# Patient Record
Sex: Female | Born: 1995 | Race: White | Hispanic: No | Marital: Married | Smoking: Never smoker
Health system: Southern US, Community
[De-identification: ages and names within clinical notes are randomized; demographics above are authoritative.]

## PROBLEM LIST (undated history)

## (undated) ENCOUNTER — Inpatient Hospital Stay (HOSPITAL_COMMUNITY): Payer: Self-pay

## (undated) DIAGNOSIS — N200 Calculus of kidney: Secondary | ICD-10-CM

## (undated) DIAGNOSIS — R011 Cardiac murmur, unspecified: Secondary | ICD-10-CM

## (undated) DIAGNOSIS — G43909 Migraine, unspecified, not intractable, without status migrainosus: Secondary | ICD-10-CM

## (undated) DIAGNOSIS — J45909 Unspecified asthma, uncomplicated: Secondary | ICD-10-CM

## (undated) HISTORY — PX: APPENDECTOMY: SHX54

## (undated) HISTORY — DX: Migraine, unspecified, not intractable, without status migrainosus: G43.909

## (undated) HISTORY — PX: TONSILLECTOMY: SUR1361

## (undated) HISTORY — PX: FOOT SURGERY: SHX648

---
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
## 2017-12-06 ENCOUNTER — Other Ambulatory Visit (HOSPITAL_COMMUNITY): Payer: Self-pay | Admitting: Obstetrics and Gynecology

## 2017-12-06 DIAGNOSIS — R590 Localized enlarged lymph nodes: Secondary | ICD-10-CM

## 2017-12-16 ENCOUNTER — Ambulatory Visit (HOSPITAL_COMMUNITY)
Admission: RE | Admit: 2017-12-16 | Discharge: 2017-12-16 | Disposition: A | Discharge status: Home / Self Care | Payer: 59 | Source: Ambulatory Visit | Attending: Obstetrics and Gynecology | Admitting: Obstetrics and Gynecology

## 2017-12-16 ENCOUNTER — Other Ambulatory Visit (HOSPITAL_COMMUNITY): Payer: Self-pay | Admitting: Obstetrics and Gynecology

## 2017-12-16 DIAGNOSIS — R590 Localized enlarged lymph nodes: Secondary | ICD-10-CM | POA: Diagnosis not present

## 2017-12-16 DIAGNOSIS — M79605 Pain in left leg: Secondary | ICD-10-CM | POA: Diagnosis present

## 2018-04-02 ENCOUNTER — Emergency Department (HOSPITAL_COMMUNITY)
Admission: EM | Admit: 2018-04-02 | Discharge: 2018-04-02 | Disposition: A | Discharge status: Home / Self Care | Payer: 59 | Attending: Emergency Medicine | Admitting: Emergency Medicine

## 2018-04-02 ENCOUNTER — Emergency Department (HOSPITAL_COMMUNITY): Payer: 59

## 2018-04-02 ENCOUNTER — Encounter (HOSPITAL_COMMUNITY): Payer: Self-pay

## 2018-04-02 DIAGNOSIS — R0789 Other chest pain: Secondary | ICD-10-CM | POA: Insufficient documentation

## 2018-04-02 DIAGNOSIS — Z79899 Other long term (current) drug therapy: Secondary | ICD-10-CM | POA: Diagnosis not present

## 2018-04-02 DIAGNOSIS — R55 Syncope and collapse: Secondary | ICD-10-CM

## 2018-04-02 LAB — URINALYSIS, ROUTINE W REFLEX MICROSCOPIC
Bilirubin Urine: NEGATIVE
Glucose, UA: NEGATIVE mg/dL
Hgb urine dipstick: NEGATIVE
KETONES UR: NEGATIVE mg/dL
Leukocytes, UA: NEGATIVE
Nitrite: NEGATIVE
PROTEIN: NEGATIVE mg/dL
Specific Gravity, Urine: 1.017 (ref 1.005–1.030)
pH: 7 (ref 5.0–8.0)

## 2018-04-02 LAB — CBC
HCT: 39.1 % (ref 36.0–46.0)
HEMOGLOBIN: 13.5 g/dL (ref 12.0–15.0)
MCH: 30.6 pg (ref 26.0–34.0)
MCHC: 34.5 g/dL (ref 30.0–36.0)
MCV: 88.7 fL (ref 78.0–100.0)
Platelets: 179 10*3/uL (ref 150–400)
RBC: 4.41 MIL/uL (ref 3.87–5.11)
RDW: 11.8 % (ref 11.5–15.5)
WBC: 3.8 10*3/uL — AB (ref 4.0–10.5)

## 2018-04-02 LAB — BASIC METABOLIC PANEL
Anion gap: 9 (ref 5–15)
BUN: 6 mg/dL (ref 6–20)
CHLORIDE: 103 mmol/L (ref 98–111)
CO2: 27 mmol/L (ref 22–32)
Calcium: 9.5 mg/dL (ref 8.9–10.3)
Creatinine, Ser: 0.81 mg/dL (ref 0.44–1.00)
GFR calc Af Amer: >60 mL/min (ref >60)
GLUCOSE: 94 mg/dL (ref 70–99)
POTASSIUM: 3.5 mmol/L (ref 3.5–5.1)
Sodium: 139 mmol/L (ref 135–145)

## 2018-04-02 LAB — CBG MONITORING, ED: GLUCOSE-CAPILLARY: 85 mg/dL (ref 70–99)

## 2018-04-02 LAB — I-STAT TROPONIN, ED: TROPONIN I, POC: 0 ng/mL (ref 0.00–0.08)

## 2018-04-02 LAB — I-STAT BETA HCG BLOOD, ED (MC, WL, AP ONLY): I-stat hCG, quantitative: <5 m[IU]/mL (ref <5)

## 2018-04-02 NOTE — Discharge Instructions (Addendum)
Please schedule an appointment with your primary care provider and your urologist to address your concerns today.  Try avoiding foods high in acidity, as well as alcohol and advil/ibuprofen/aleve/goodies/BC powder as these can irritate the stomach.  Return to the ER for new or concerning symptoms.

## 2018-04-02 NOTE — ED Notes (Signed)
Patient transported to X-ray 

## 2018-04-02 NOTE — ED Provider Notes (Signed)
[facility] EMERGENCY DEPARTMENT Provider Note   CSN: [id number] Arrival date & time: 04/02/18  1304     History   Chief Complaint Chief Complaint  Patient presents with  . Chest Pain    HPI Tara Mccarthy is a 22 y.o. female with past medical history of nephrolithiasis, presenting to the ED with multiple complaints.  Patient complaining of near syncopal episode that occurred yesterday.  She states she started seeing black spots and felt lightheaded, and then fell to the floor though did not completely pass out.  Mother witnessed event states she looked pale in the face and caught her as she fell.  No head trauma.  She denies prodrome of chest pain, shortness of breath, headache, or other significant symptoms.  She also complains of intermittent burning central chest pain.  Pain comes and goes, not as exertional.  Is not associated with shortness of breath palpitations.  Lastly, patient complains of intermittent kidney pain which she associates to her history of nephrolithiasis.  She states she recently had a UTI couple months ago that lasted about 1 month though has resolution of symptoms.  She states pain in her back has intermittent and radiates around towards her abdomen which is similar to her pain due to nephrolithiasis.  Denies any urinary symptoms at this time.  Denies pelvic complaints.  Denies fever, nausea, vomiting.  Her urologist is with [facility].  The history is provided by the patient and a parent.    Past Medical History:  Diagnosis Date  . Migraine   . Nephrolithiasis     Patient Active Problem List   Diagnosis Date Noted  . Kidney stone 11/11/2016  . Sepsis ([facility]) 11/11/2016  . Acute lower UTI 11/11/2016  . Migraine 08/13/2016  . Tremor 08/13/2016    Past Surgical History:  Procedure Laterality Date  . APPENDECTOMY    . FOOT SURGERY Left    Turf toe- fixed the tendon  . TONSILLECTOMY       OB History   None      Home  Medications    Prior to Admission medications   Medication Sig Start Date End Date Taking? Authorizing Provider  Acetaminophen (MIDOL PO) Take 1 tablet by mouth daily as needed (pain).   Yes [provider]  desonide (DESOWEN) 0.05 % ointment Apply 1 application topically 2 (two) times daily as needed for rash. On mouth 03/25/18  Yes [provider]  etonogestrel (NEXPLANON) 68 MG IMPL implant 68 mg by Subdermal route continuous.    Yes [provider]  nystatin ointment (MYCOSTATIN) Apply 1 application topically 2 (two) times daily as needed (for flare ups).  03/25/18  Yes [provider]  oxyCODONE-acetaminophen (PERCOCET/ROXICET) 5-325 MG tablet Take 1-2 tablets by mouth every 6 (six) hours as needed for moderate pain. Patient not taking: Reported on 04/02/2018 11/12/16   Joseph Art, DO  sulfamethoxazole-trimethoprim (BACTRIM,SEPTRA) 400-80 MG tablet Take 1 tablet by mouth every 12 (twelve) hours. Patient not taking: Reported on 04/02/2018 11/12/16   Joseph Art, DO  tamsulosin (FLOMAX) 0.4 MG CAPS capsule Take 1 capsule (0.4 mg total) by mouth daily after breakfast. Patient not taking: Reported on 04/02/2018 11/13/16   Joseph Art, DO    Family History Family History  Problem Relation Age of Onset  . Nephrolithiasis Father   . Tremor Neg Hx     Social History Social History   Tobacco Use  . Smoking status: Never Smoker  .  Smokeless tobacco: Never Used  Substance Use Topics  . Alcohol use: No  . Drug use: No     Allergies   Ceftriaxone   Review of Systems Review of Systems  Constitutional: Negative for fever.  Eyes: Negative for photophobia and visual disturbance.  Respiratory: Negative for cough and shortness of breath.   Cardiovascular: Positive for chest pain. Negative for palpitations.  Gastrointestinal: Negative for nausea and vomiting.  Genitourinary: Positive for flank pain. Negative for dysuria, frequency, pelvic pain,  vaginal bleeding and vaginal discharge.  Neurological: Negative for dizziness, facial asymmetry, weakness, numbness and headaches.  All other systems reviewed and are negative.    Physical Exam Updated Vital Signs BP 114/73 (BP Location: Right Arm)   Pulse 84   Temp 99.1 F (37.3 C) (Oral)   Resp 16   LMP 03/25/2018 (Approximate)   SpO2 100%   Physical Exam  Constitutional: She is oriented to person, place, and time. She appears well-developed and well-nourished. No distress.  Well-appearing.,  Not in distress  HENT:  Head: Normocephalic and atraumatic.  Mouth/Throat: Oropharynx is clear and moist.  Eyes: Pupils are equal, round, and reactive to light. Conjunctivae and EOM are normal.  Neck: Normal range of motion. Neck supple.  Cardiovascular: Normal rate, regular rhythm, normal heart sounds and intact distal pulses.  Pulmonary/Chest: Effort normal and breath sounds normal. No respiratory distress. She exhibits no tenderness.  Abdominal: Soft. Bowel sounds are normal. She exhibits no distension. There is no tenderness. There is no rebound and no guarding.  Bilateral CVA tenderness  Musculoskeletal: Normal range of motion.  No lower extremity swelling or pain.  Neurological: She is alert and oriented to person, place, and time.  Mental Status:  Alert, oriented, thought content appropriate, able to give a coherent history. Speech fluent without evidence of aphasia. Able to follow 2 step commands without difficulty.  Cranial Nerves:  II:  Peripheral visual fields grossly normal, pupils equal, round, reactive to light III,IV, VI: ptosis not present, extra-ocular motions intact bilaterally  V,VII: smile symmetric, facial light touch sensation equal VIII: hearing grossly normal to voice  X: uvula elevates symmetrically  XI: bilateral shoulder shrug symmetric and strong XII: midline tongue extension without fassiculations Motor:  Normal tone. 5/5 in upper and lower extremities  bilaterally including strong and equal grip strength and dorsiflexion/plantar flexion Sensory: Pinprick and light touch normal in all extremities.  Deep Tendon Reflexes: 2+ and symmetric in the biceps and patella Cerebellar: normal finger-to-nose with bilateral upper extremities Gait: normal gait and balance CV: distal pulses palpable throughout    Skin: Skin is warm.  Psychiatric: She has a normal mood and affect. Her behavior is normal.  Nursing note and vitals reviewed.    ED Treatments / Results  Labs (all labs ordered are listed, but only abnormal results are displayed) Labs Reviewed  CBC - Abnormal; Notable for the following components:      Result Value   WBC 3.8 (*)    All other components within normal limits  URINALYSIS, ROUTINE W REFLEX MICROSCOPIC - Abnormal; Notable for the following components:   APPearance TURBID (*)    Bacteria, UA FEW (*)    All other components within normal limits  BASIC METABOLIC PANEL  CBG MONITORING, ED  I-STAT TROPONIN, ED  I-STAT BETA HCG BLOOD, ED ([facility], [facility], [facility] ONLY)    EKG EKG Interpretation  Date/Time:  Wednesday April 02 2018 13:11:58 EDT Ventricular Rate:  91 PR Interval:  110 QRS Duration: 78  QT Interval:  338 QTC Calculation: 415 R Axis:   102 Text Interpretation:  Sinus rhythm with short PR Rightward axis Borderline ECG Confirmed by Benjiman Core [phone]) on 04/02/2018 3:13:36 PM   Radiology Dg Chest 2 View  Result Date: 04/02/2018 CLINICAL DATA:  Chest pain EXAM: CHEST - 2 VIEW COMPARISON:  11/26/2001 report FINDINGS: The heart size and mediastinal contours are within normal limits. Both lungs are clear. The visualized skeletal structures are unremarkable. IMPRESSION: No active cardiopulmonary disease. Electronically Signed   By: Jasmine Pang M.D.   On: 04/02/2018 14:54    Procedures Procedures (including critical care time)  Medications Ordered in ED Medications - No data to display   Initial Impression /  Assessment and Plan / ED Course  I have reviewed the triage vital signs and the nursing notes.  Pertinent labs & imaging results that were available during my care of the patient were reviewed by me and considered in my medical decision making (see chart for details).     Patient to the ED with multiple complaints.  Central chest pain intermittent burning, not exertional.  Does not appear to be cardiac or pulmonary related.  Suspect possibility of GERD, as patient reports history of reflux.  Patient also complaining of near syncopal episode with prodrome of lightheadedness.  As well as kidney pain without urinary symptoms.  Exam is normal; normal heart and lung exam, normal neurologic exam, abdominal exam is benign.  Signs are normal, urine is negative, BMP normal, troponin negative, negative beta-hCG, and CBC is within normal limits.  Chest x-ray is negative, EKG is without arrhythmia.  Provided reassurance, discussed diet modification for possibility of GERD, recommend urology follow-up regarding frequent CVA pain, PCP follow-up.  Patient is well-appearing and safe for discharge.  Discussed results, findings, treatment and follow up. Patient advised of return precautions. Patient verbalized understanding and agreed with plan.   Final Clinical Impressions(s) / ED Diagnoses   Final diagnoses:  Atypical chest pain  Near syncope    ED Discharge Orders    None       Milledge Gerding, [place] N, PA-C 04/02/18 1554    Benjiman Core, MD 04/02/18 2229

## 2018-04-02 NOTE — ED Notes (Signed)
CBG 85

## 2018-04-02 NOTE — ED Triage Notes (Signed)
Pt presents for evaluation of intermittent central chest pain, had some lower back pain yesterday and a syncopal episode in the kitchen last night. Mother reports she was not fully unconscious, pt reports fogginess and visual changes during the event.

## 2018-06-26 ENCOUNTER — Ambulatory Visit: Payer: 59 | Admitting: Family Medicine

## 2018-07-02 ENCOUNTER — Ambulatory Visit: Payer: 59 | Admitting: Family Medicine

## 2018-07-02 DIAGNOSIS — Z0289 Encounter for other administrative examinations: Secondary | ICD-10-CM

## 2019-05-15 ENCOUNTER — Encounter: Payer: Self-pay | Admitting: Family Medicine

## 2019-05-15 ENCOUNTER — Ambulatory Visit (INDEPENDENT_AMBULATORY_CARE_PROVIDER_SITE_OTHER): Payer: 59 | Admitting: Family Medicine

## 2019-05-15 ENCOUNTER — Other Ambulatory Visit: Payer: Self-pay

## 2019-05-15 VITALS — BP 82/60 | HR 79 | Temp 98.5°F | Ht 62.0 in | Wt 107.2 lb

## 2019-05-15 DIAGNOSIS — Z0000 Encounter for general adult medical examination without abnormal findings: Secondary | ICD-10-CM

## 2019-05-15 DIAGNOSIS — R5383 Other fatigue: Secondary | ICD-10-CM

## 2019-05-15 DIAGNOSIS — Z7689 Persons encountering health services in other specified circumstances: Secondary | ICD-10-CM

## 2019-05-15 DIAGNOSIS — Z8349 Family history of other endocrine, nutritional and metabolic diseases: Secondary | ICD-10-CM | POA: Diagnosis not present

## 2019-05-15 DIAGNOSIS — Z2821 Immunization not carried out because of patient refusal: Secondary | ICD-10-CM

## 2019-05-15 NOTE — Progress Notes (Signed)
Tara Mccarthy is a 23 y.o. female  Chief Complaint  Patient presents with  . Establish Care    Pt c/o hx of Thyroid in her family. Pt also has an GYN that she will schedule an ov for her pap. Ptdeclines flu injection today and pt is not fasting today.    HPI: Tara Mccarthy is a 23 y.o. female here as a new patient to establish care with our office and for her annual CPE. She declines flu vaccine.  She would like her thyroid function tested d/t family h/o thyroid dysfunction - mom, maternal cousins, maternal aunt.   Last PAP: has GYN and will schedule PAP appt  Diet/Exercise:  Healthy diet, regular exercise  Pt complains of fatigue x 1 year. She sleeps well at night, exercises regularly. PHQ-9 = 0. She notes a h/o iron deficiency but she did not take iron supplement as was recommended.   Past Medical History:  Diagnosis Date  . Migraine   . Nephrolithiasis     Past Surgical History:  Procedure Laterality Date  . APPENDECTOMY    . FOOT SURGERY Left    Turf toe- fixed the tendon  . TONSILLECTOMY      Social History   Socioeconomic History  . Marital status: Single    Spouse name: Not on file  . Number of children: 0  . Years of education: College  . Highest education level: Not on file  Occupational History  . Occupation: Ship broker    Comment: [employer]  . Financial resource strain: Not on file  . Food insecurity    Worry: Not on file    Inability: Not on file  . Transportation needs    Medical: Not on file    Non-medical: Not on file  Tobacco Use  . Smoking status: Never Smoker  . Smokeless tobacco: Never Used  Substance and Sexual Activity  . Alcohol use: No  . Drug use: No  . Sexual activity: Not on file    Comment: couple of weeks ago  Lifestyle  . Physical activity    Days per week: Not on file    Minutes per session: Not on file  . Stress: Not on file  Relationships  . Social Herbalist on phone: Not on  file    Gets together: Not on file    Attends religious service: Not on file    Active member of club or organization: Not on file    Attends meetings of clubs or organizations: Not on file    Relationship status: Not on file  . Intimate partner violence    Fear of current or ex partner: Not on file    Emotionally abused: Not on file    Physically abused: Not on file    Forced sexual activity: Not on file  Other Topics Concern  . Not on file  Social History Narrative   Lives w/ mom and dad   Caffeine use: Multiple drinks per week (more than 5)    Family History  Problem Relation Age of Onset  . Nephrolithiasis Father   . Tremor Neg Hx       There is no immunization history on file for this patient.  Outpatient Encounter Medications as of 05/15/2019  Medication Sig Note  . Acetaminophen (MIDOL PO) Take 1 tablet by mouth daily as needed (pain).   Marland Kitchen desonide (DESOWEN) 0.05 % ointment Apply 1 application topically 2 (two) times daily  as needed for rash. On mouth   . nystatin ointment (MYCOSTATIN) Apply 1 application topically 2 (two) times daily as needed (for flare ups).    Marland Kitchen etonogestrel (NEXPLANON) 68 MG IMPL implant 68 mg by Subdermal route continuous.  04/02/2018: Insert almost a year ago.  . [DISCONTINUED] oxyCODONE-acetaminophen (PERCOCET/ROXICET) 5-325 MG tablet Take 1-2 tablets by mouth every 6 (six) hours as needed for moderate pain. (Patient not taking: Reported on 04/02/2018)   . [DISCONTINUED] sulfamethoxazole-trimethoprim (BACTRIM,SEPTRA) 400-80 MG tablet Take 1 tablet by mouth every 12 (twelve) hours. (Patient not taking: Reported on 04/02/2018)   . [DISCONTINUED] tamsulosin (FLOMAX) 0.4 MG CAPS capsule Take 1 capsule (0.4 mg total) by mouth daily after breakfast. (Patient not taking: Reported on 04/02/2018)    No facility-administered encounter medications on file as of 05/15/2019.      ROS: Gen: no fever, chills; + fatigue Skin: no rash, itching ENT: no ear pain,  ear drainage, nasal congestion, rhinorrhea, sinus pressure, sore throat Eyes: no blurry vision, double vision Resp: no cough, wheeze,SOB Breast: no breast tenderness, no nipple discharge, no breast masses CV: no CP, palpitations, LE edema,  GI: no heartburn, n/v/d/c, abd pain GU: no dysuria, urgency, frequency, hematuria; no vaginal itching, odor, discharge MSK: no joint pain, myalgias, back pain Neuro: no dizziness, headache, weakness, vertigo Psych: no depression, anxiety, insomnia   Allergies  Allergen Reactions  . Ceftriaxone Rash    BP (!) 82/60 (BP Location: Right Arm, Patient Position: Sitting, Cuff Size: Small)   Pulse 79   Temp 98.5 F (36.9 C) (Oral)   Ht 5\' 2"  (1.575 m)   Wt 107 lb 3.2 oz (48.6 kg)   LMP 03/23/2019   SpO2 100%   BMI 19.61 kg/m    Physical Exam  Constitutional: She is oriented to person, place, and time. She appears well-developed and well-nourished. No distress.  HENT:  Head: Normocephalic and atraumatic.  Right Ear: Tympanic membrane and ear canal normal.  Left Ear: Tympanic membrane and ear canal normal.  Nose: Nose normal.  Mouth/Throat: Oropharynx is clear and moist and mucous membranes are normal.  Eyes: Pupils are equal, round, and reactive to light. Conjunctivae are normal.  Neck: Neck supple. No thyromegaly present.  Cardiovascular: Normal rate, regular rhythm, normal heart sounds and intact distal pulses.  No murmur heard. Pulmonary/Chest: Effort normal and breath sounds normal. No respiratory distress. She has no wheezes. She has no rhonchi.  Abdominal: Soft. Bowel sounds are normal. She exhibits no distension and no mass. There is no abdominal tenderness.  Musculoskeletal:        General: No edema.  Lymphadenopathy:    She has no cervical adenopathy.  Neurological: She is alert and oriented to person, place, and time. She exhibits normal muscle tone. Coordination normal.  Skin: Skin is warm and dry.  Psychiatric: She has a normal  mood and affect. Her behavior is normal.     A/P:  1. Annual physical exam - declines flu vaccine - will schedule PAP appt with her GYN - discussed importance of regular CV exercise, healthy diet, adequate sleep - next CPE in 1 year  2. Encounter to establish care with new doctor  3. Family history of thyroid dysfunction - T4, free - TSH - T3  4. Fatigue, unspecified type - CBC - Iron, TIBC and Ferritin Panel - T4, free - TSH - T3

## 2019-05-15 NOTE — Patient Instructions (Signed)
Preventive Care 18-21 Years Old, Female Preventive care refers to lifestyle choices and visits with your health care provider that can promote health and wellness. At this stage in your life, you may start seeing a primary care physician instead of a pediatrician. Your health care is now your responsibility. Preventive care for young adults includes:  A yearly physical exam. This is also called an annual wellness visit.  Regular dental and eye exams.  Immunizations.  Screening for certain conditions.  Healthy lifestyle choices, such as diet and exercise. What can I expect for my preventive care visit? Physical exam Your health care provider may check:  Height and weight. These may be used to calculate body mass index (BMI), which is a measurement that tells if you are at a healthy weight.  Heart rate and blood pressure.  Body temperature. Counseling Your health care provider may ask you questions about:  Past medical problems and family medical history.  Alcohol, tobacco, and drug use.  Home and relationship well-being.  Access to firearms.  Emotional well-being.  Diet, exercise, and sleep habits.  Sexual activity and sexual health.  Method of birth control.  Menstrual cycle.  Pregnancy history. What immunizations do I need?  Influenza (flu) vaccine  This is recommended every year. Tetanus, diphtheria, and pertussis (Tdap) vaccine  You may need a Td booster every 10 years. Varicella (chickenpox) vaccine  You may need this vaccine if you have not already been vaccinated. Human papillomavirus (HPV) vaccine  If recommended by your health care provider, you may need three doses over 6 months. Measles, mumps, and rubella (MMR) vaccine  You may need at least one dose of MMR. You may also need a second dose. Meningococcal conjugate (MenACWY) vaccine  One dose is recommended if you are 19-21 years old and a first-year college student living in a residence hall,  or if you have one of several medical conditions. You may also need additional booster doses. Pneumococcal conjugate (PCV13) vaccine  You may need this if you have certain conditions and were not previously vaccinated. Pneumococcal polysaccharide (PPSV23) vaccine  You may need one or two doses if you smoke cigarettes or if you have certain conditions. Hepatitis A vaccine  You may need this if you have certain conditions or if you travel or work in places where you may be exposed to hepatitis A. Hepatitis B vaccine  You may need this if you have certain conditions or if you travel or work in places where you may be exposed to hepatitis B. Haemophilus influenzae type b (Hib) vaccine  You may need this if you have certain risk factors. You may receive vaccines as individual doses or as more than one vaccine together in one shot (combination vaccines). Talk with your health care provider about the risks and benefits of combination vaccines. What tests do I need? Blood tests  Lipid and cholesterol levels. These may be checked every 5 years starting at age 20.  Hepatitis C test.  Hepatitis B test. Screening  Pelvic exam and Pap test. This may be done every 3 years starting at age 21.  Sexually transmitted disease (STD) testing, if you are at risk.  BRCA-related cancer screening. This may be done if you have a family history of breast, ovarian, tubal, or peritoneal cancers. Other tests  Tuberculosis skin test.  Vision and hearing tests.  Skin exam.  Breast exam. Follow these instructions at home: Eating and drinking   Eat a diet that includes fresh fruits and   vegetables, whole grains, lean protein, and low-fat dairy products.  Drink enough fluid to keep your urine pale yellow.  Do not drink alcohol if: ? Your health care provider tells you not to drink. ? You are pregnant, may be pregnant, or are planning to become pregnant. ? You are under the legal drinking age. In the  [place] the legal drinking age is 21.  If you drink alcohol: ? Limit how much you have to 0-1 drink a day. ? Be aware of how much alcohol is in your drink. In the [place] one drink equals one 12 oz bottle of beer (355 mL), one 5 oz glass of wine (148 mL), or one 1 oz glass of hard liquor (44 mL). Lifestyle  Take daily care of your teeth and gums.  Stay active. Exercise at least 30 minutes 5 or more days of the week.  Do not use any products that contain nicotine or tobacco, such as cigarettes, e-cigarettes, and chewing tobacco. If you need help quitting, ask your health care provider.  Do not use drugs.  If you are sexually active, practice safe sex. Use a condom or other form of birth control (contraception) in order to prevent pregnancy and STIs (sexually transmitted infections). If you plan to become pregnant, see your health care provider for a pre-conception visit.  Find healthy ways to cope with stress, such as: ? Meditation, yoga, or listening to music. ? Journaling. ? Talking to a trusted person. ? Spending time with friends and family. Safety  Always wear your seat belt while driving or riding in a vehicle.  Do not drive if you have been drinking alcohol. Do not ride with someone who has been drinking.  Do not drive when you are tired or distracted. Do not text while driving.  Wear a helmet and other protective equipment during sports activities.  If you have firearms in your house, make sure you follow all gun safety procedures.  Seek help if you have been bullied, physically abused, or sexually abused.  Use the Internet responsibly to avoid dangers such as online bullying and online sex predators. What's next?  Go to your health care provider once a year for a well check visit.  Ask your health care provider how often you should have your eyes and teeth checked.  Stay up to date on all vaccines. This information is not intended to replace advice given to you by  your health care provider. Make sure you discuss any questions you have with your health care provider. Document Released: 11/17/2015 Document Revised: 06/26/2018 Document Reviewed: 06/26/2018 Elsevier Patient Education  2020 Elsevier Inc.  

## 2019-05-15 NOTE — Addendum Note (Signed)
Addended by: Lynnea Ferrier on: 05/15/2019 03:17 PM   Modules accepted: Orders

## 2019-05-16 LAB — IRON,TIBC AND FERRITIN PANEL
%SAT: 46 % (calc) — ABNORMAL HIGH (ref 16–45)
Ferritin: 37 ng/mL (ref 16–154)
Iron: 116 ug/dL (ref 40–190)
TIBC: 253 mcg/dL (calc) (ref 250–450)

## 2019-05-16 LAB — CBC
HCT: 36.7 % (ref 35.0–45.0)
Hemoglobin: 12.2 g/dL (ref 11.7–15.5)
MCH: 30.4 pg (ref 27.0–33.0)
MCHC: 33.2 g/dL (ref 32.0–36.0)
MCV: 91.5 fL (ref 80.0–100.0)
MPV: 12.9 fL — ABNORMAL HIGH (ref 7.5–12.5)
Platelets: 177 10*3/uL (ref 140–400)
RBC: 4.01 10*6/uL (ref 3.80–5.10)
RDW: 12.3 % (ref 11.0–15.0)
WBC: 4.9 10*3/uL (ref 3.8–10.8)

## 2019-05-16 LAB — T3: T3, Total: 82 ng/dL (ref 76–181)

## 2019-05-16 LAB — T4, FREE: Free T4: 1.2 ng/dL (ref 0.8–1.8)

## 2019-05-16 LAB — TSH: TSH: 0.58 mIU/L

## 2019-08-23 IMAGING — US US EXTREM LOW*L* LIMITED
1 series · 15 of 25 positions shown · non-contrast
Comparison: 11/11/2016. CT of the abdomen and pelvis.

CLINICAL DATA: Possible inguinal lymphadenopathy

EXAM:
ULTRASOUND LEFT LOWER EXTREMITY LIMITED
TECHNIQUE: Ultrasound examination of the lower extremity soft tissues was
performed in the area of clinical concern.

[Series 1: us extrem low*left* limited · 15 of 40 slices shown]
[im 1/40]
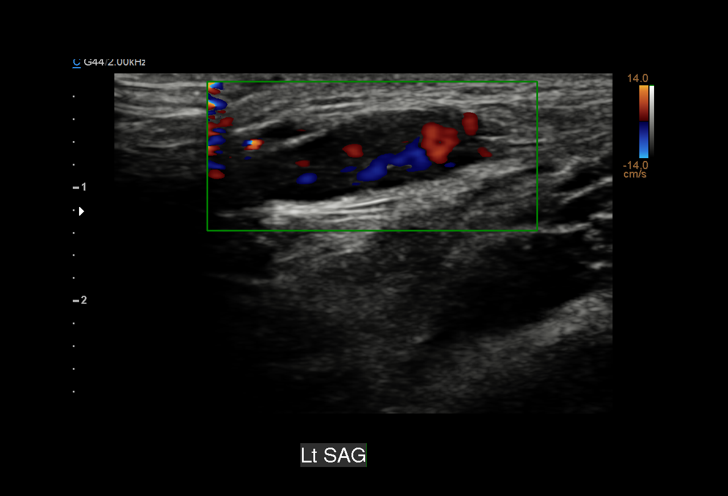
[im 4/40]
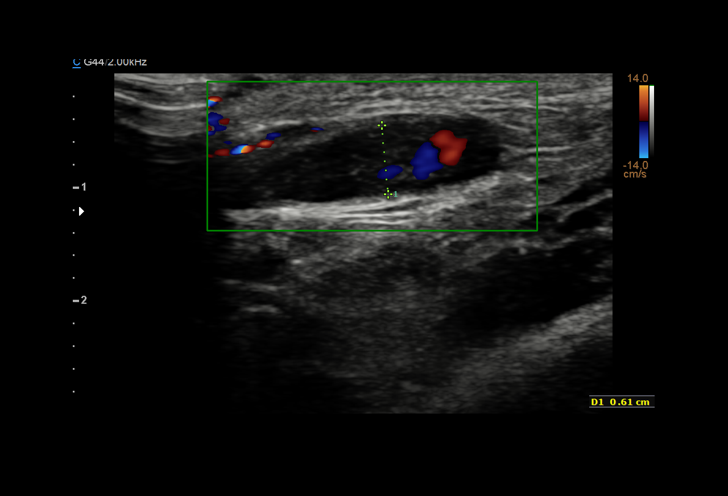
[im 7/40]
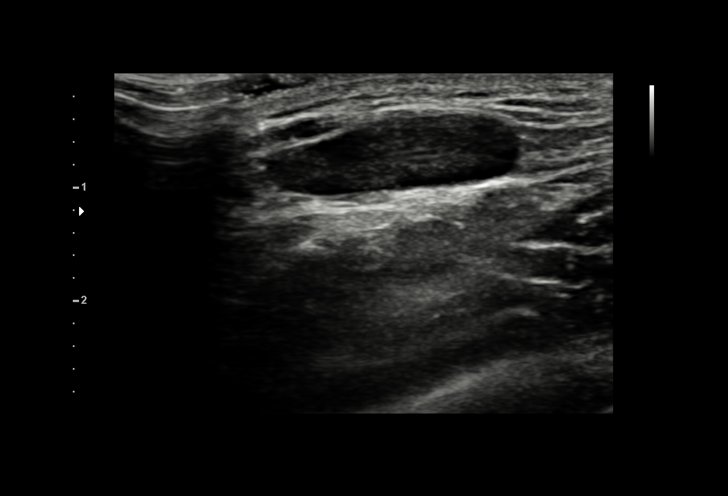
[im 9/40]
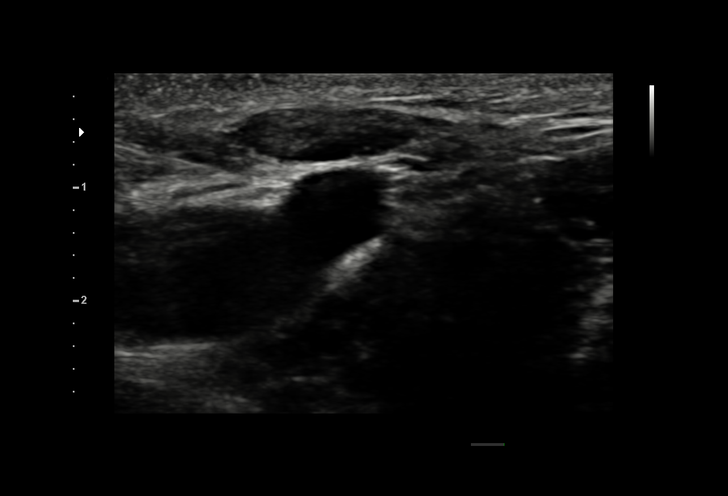
[im 12/40]
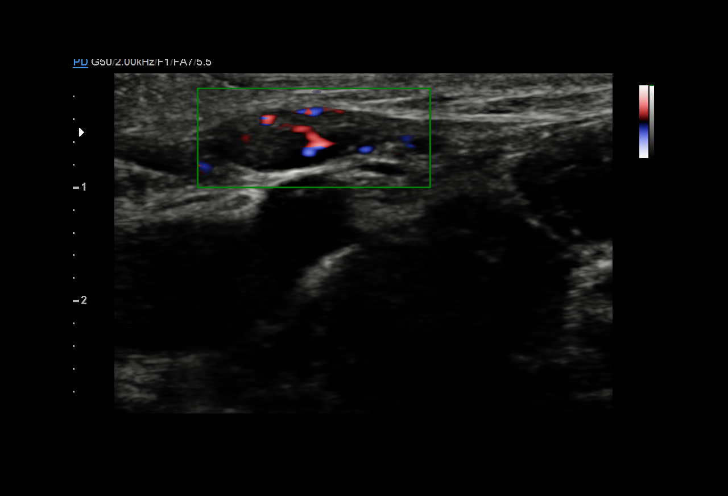
[im 15/40]
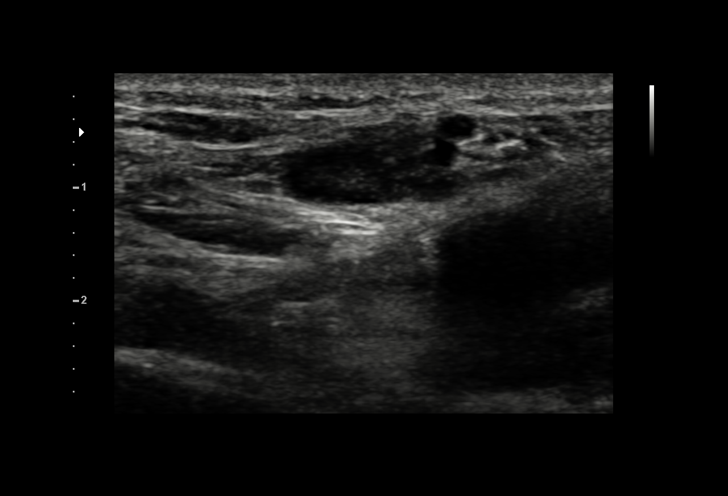
[im 17/40]
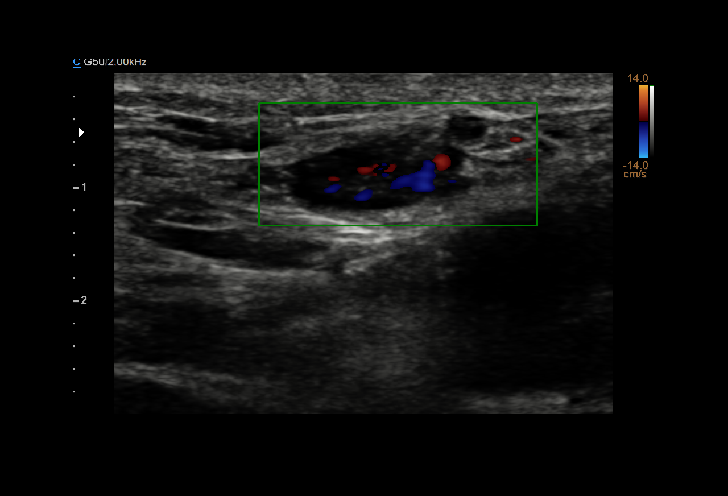
[im 20/40]
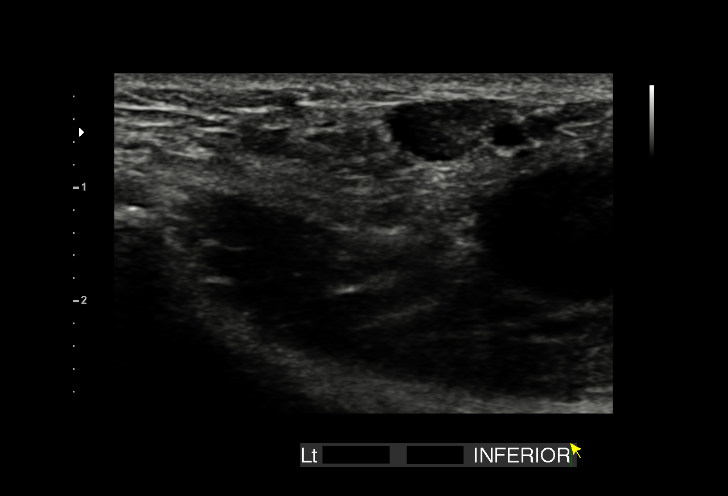
[im 23/40]
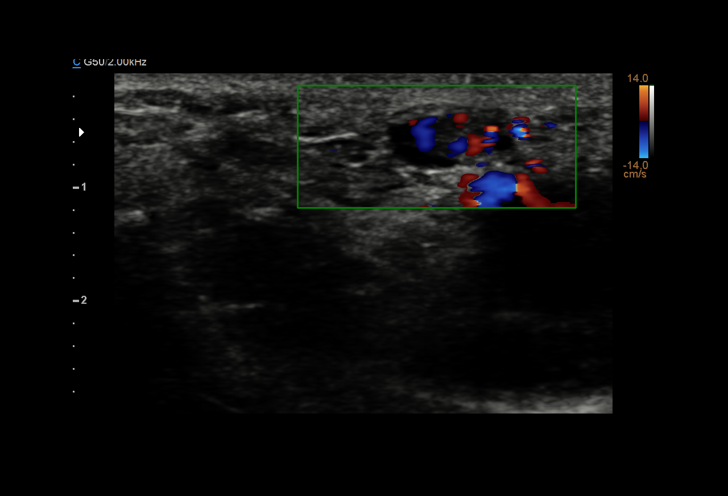
[im 25/40]
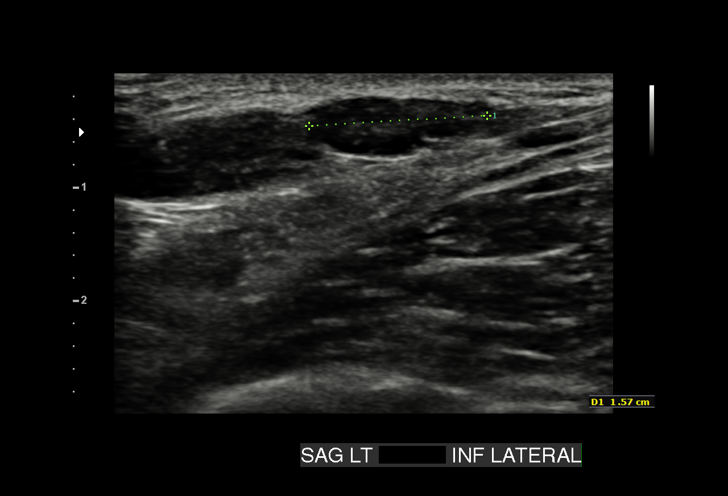
[im 28/40]
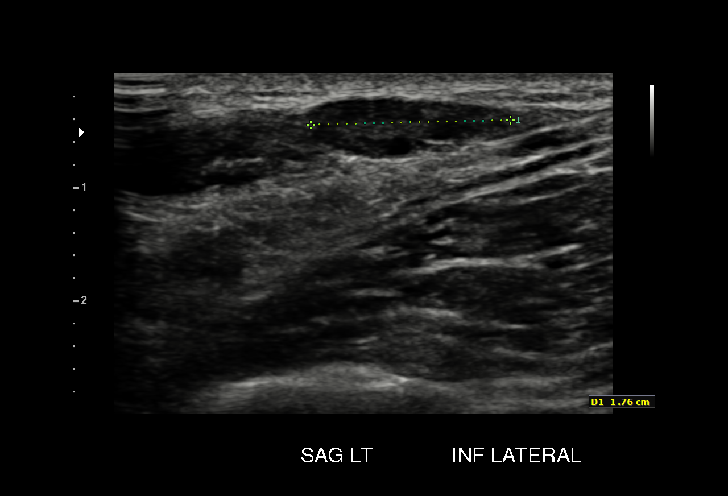
[im 31/40]
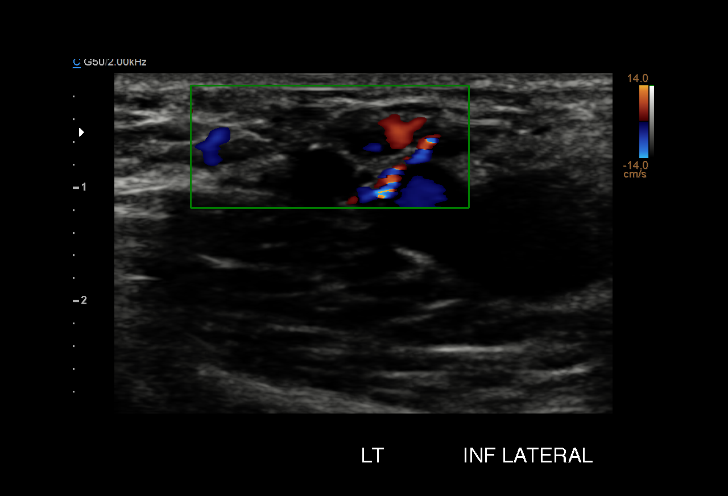
[im 33/40]
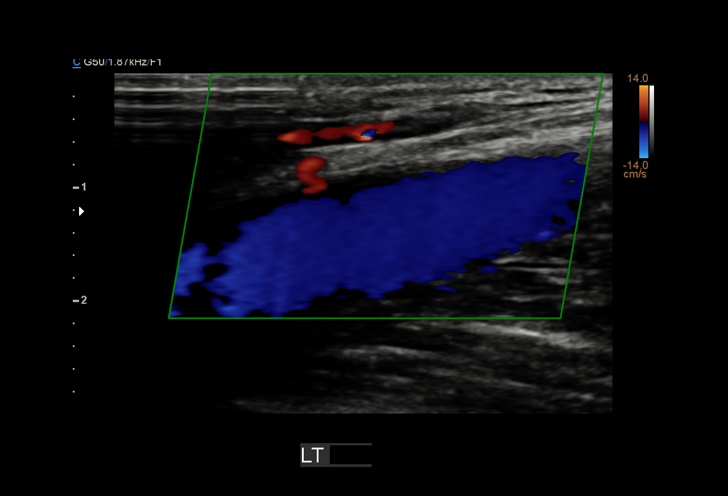
[im 36/40]
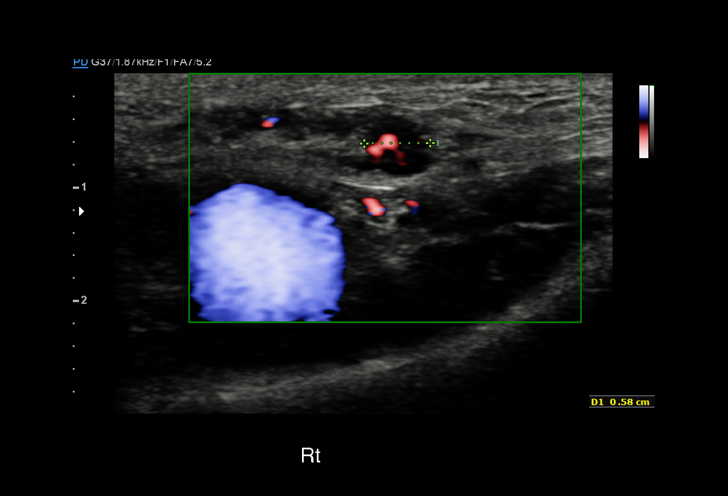
[im 40/40]
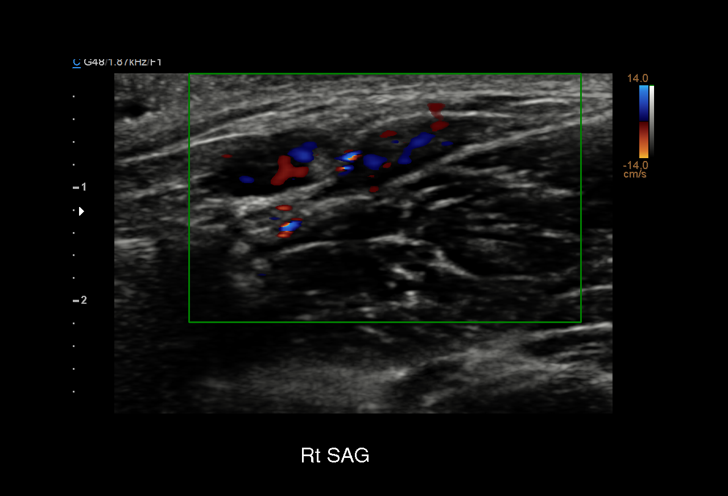

[15 of 25 positions shown; findings below may reference images not displayed]

FINDINGS: Scanning in the left groin reveals multiple inguinal lymph nodes.
The largest of these measures 6 mm in short axis. Limited evaluation
in the right inguinal region shows similar areas. Previous CT shows
similar lymph nodes in the inguinal regions bilaterally.
IMPRESSION: Bilateral lymph nodes likely reactive in nature. Clinical follow-up
is recommended.

## 2020-03-08 ENCOUNTER — Encounter: Payer: Self-pay | Admitting: Family Medicine

## 2020-03-08 ENCOUNTER — Ambulatory Visit: Payer: 59 | Admitting: Family Medicine

## 2020-03-08 ENCOUNTER — Other Ambulatory Visit: Payer: Self-pay

## 2020-03-08 VITALS — BP 118/64 | Temp 99.1°F | Ht 62.0 in | Wt 113.0 lb

## 2020-03-08 DIAGNOSIS — R202 Paresthesia of skin: Secondary | ICD-10-CM | POA: Insufficient documentation

## 2020-03-08 DIAGNOSIS — B349 Viral infection, unspecified: Secondary | ICD-10-CM | POA: Diagnosis not present

## 2020-03-08 DIAGNOSIS — H8309 Labyrinthitis, unspecified ear: Secondary | ICD-10-CM | POA: Insufficient documentation

## 2020-03-08 NOTE — Progress Notes (Addendum)
Established Patient Office Visit  Subjective:  Patient ID: Tara Mccarthy, female    DOB: 1996-01-28  Age: 24 y.o. MRN: [id number]  CC:  Chief Complaint  Patient presents with  . Numbness    patient states that yesterday her right arm was tingling with fingers turning purple. Feeling a little off balance today.     HPI Tara Mccarthy presents for evaluation of a 1 day history of hot and cold spells, spinning sensation and sense of movement that was not there.  She describes a brief episode of tingling in the fingers of right arm.  Her fingers turn blue as well.  There was no weakness.  Denies dysphagia.  Ongoing history of headaches that have been classified as migraine.  They are stable in usual for her.  She denies stuffy nose drainage cough shortness of breath or diarrhea.  There are no myalgias or arthralgias.  Past Medical History:  Diagnosis Date  . Migraine   . Nephrolithiasis     Past Surgical History:  Procedure Laterality Date  . APPENDECTOMY    . FOOT SURGERY Left    Turf toe- fixed the tendon  . TONSILLECTOMY      Family History  Problem Relation Age of Onset  . Nephrolithiasis Father   . Tremor Neg Hx     Social History   Socioeconomic History  . Marital status: Single    Spouse name: Not on file  . Number of children: 0  . Years of education: College  . Highest education level: Not on file  Occupational History  . Occupation: Consulting civil engineer    Comment: Copywriter, advertising  Tobacco Use  . Smoking status: Never Smoker  . Smokeless tobacco: Never Used  Substance and Sexual Activity  . Alcohol use: No  . Drug use: No  . Sexual activity: Not on file    Comment: couple of weeks ago  Other Topics Concern  . Not on file  Social History Narrative   Lives w/ mom and dad   Caffeine use: Multiple drinks per week (more than 5)   Social Determinants of Health   Financial Resource Strain:   . Difficulty of Paying Living Expenses: Not on file  Food  Insecurity:   . Worried About Programme researcher, broadcasting/film/video in the Last Year: Not on file  . Ran Out of Food in the Last Year: Not on file  Transportation Needs:   . Lack of Transportation (Medical): Not on file  . Lack of Transportation (Non-Medical): Not on file  Physical Activity:   . Days of Exercise per Week: Not on file  . Minutes of Exercise per Session: Not on file  Stress:   . Feeling of Stress : Not on file  Social Connections:   . Frequency of Communication with Friends and Family: Not on file  . Frequency of Social Gatherings with Friends and Family: Not on file  . Attends Religious Services: Not on file  . Active Member of Clubs or Organizations: Not on file  . Attends Banker Meetings: Not on file  . Marital Status: Not on file  Intimate Partner Violence:   . Fear of Current or Ex-Partner: Not on file  . Emotionally Abused: Not on file  . Physically Abused: Not on file  . Sexually Abused: Not on file    Outpatient Medications Prior to Visit  Medication Sig Dispense Refill  . Acetaminophen (MIDOL PO) Take 1 tablet by mouth daily as needed (pain).    Marland Kitchen  desonide (DESOWEN) 0.05 % ointment Apply 1 application topically 2 (two) times daily as needed for rash. On mouth  0  . nystatin ointment (MYCOSTATIN) Apply 1 application topically 2 (two) times daily as needed (for flare ups).   2  . etonogestrel (NEXPLANON) 68 MG IMPL implant 68 mg by Subdermal route continuous.  (Patient not taking: Reported on 03/08/2020)     No facility-administered medications prior to visit.    Allergies  Allergen Reactions  . Ceftriaxone Rash    ROS Review of Systems  Constitutional: Positive for diaphoresis. Negative for chills, fatigue, fever and unexpected weight change.  HENT: Negative for congestion, postnasal drip and sore throat.   Eyes: Negative for photophobia and visual disturbance.  Respiratory: Negative for cough and shortness of breath.   Cardiovascular: Negative.     Gastrointestinal: Negative for diarrhea, nausea and vomiting.  Genitourinary: Negative.   Musculoskeletal: Negative for arthralgias and myalgias.  Neurological: Positive for numbness and headaches. Negative for weakness and light-headedness.  Hematological: Does not bruise/bleed easily.      Objective:    Physical Exam Vitals and nursing note reviewed.  Constitutional:      General: She is not in acute distress.    Appearance: Normal appearance. She is not ill-appearing or toxic-appearing.  HENT:     Head: Normocephalic and atraumatic.     Right Ear: Tympanic membrane, ear canal and external ear normal.     Left Ear: Tympanic membrane, ear canal and external ear normal.     Mouth/Throat:     Mouth: Mucous membranes are moist.     Pharynx: Oropharynx is clear. No oropharyngeal exudate or posterior oropharyngeal erythema.  Eyes:     General: No scleral icterus.       Right eye: No discharge.        Left eye: No discharge.     Conjunctiva/sclera: Conjunctivae normal.     Pupils: Pupils are equal, round, and reactive to light.  Cardiovascular:     Rate and Rhythm: Normal rate and regular rhythm.     Pulses: Normal pulses.     Heart sounds: Normal heart sounds.  Pulmonary:     Effort: Pulmonary effort is normal.     Breath sounds: Normal breath sounds.  Abdominal:     General: Bowel sounds are normal.  Musculoskeletal:     Cervical back: Normal range of motion. No swelling, deformity, rigidity, spasms, tenderness or bony tenderness. Normal range of motion.       Back:  Lymphadenopathy:     Cervical: No cervical adenopathy.  Skin:    General: Skin is warm and dry.  Neurological:     Mental Status: She is alert and oriented to person, place, and time.  Psychiatric:        Mood and Affect: Mood normal.        Behavior: Behavior normal.     BP 118/64   Temp 99.1 F (37.3 C) (Oral)   Ht 5\' 2"  (1.575 m)   Wt 113 lb (51.3 kg)   BMI 20.67 kg/m  Wt Readings from Last 3  Encounters:  03/08/20 113 lb (51.3 kg)  05/15/19 107 lb 3.2 oz (48.6 kg)  11/11/16 109 lb 12.6 oz (49.8 kg) (17 %, Z= -0.95)*   * Growth percentiles are based on CDC (Girls, 2-20 Years) data.     Health Maintenance Due  Topic Date Due  . Hepatitis C Screening  Never done  . TETANUS/TDAP  Never done  .  PAP-Cervical Cytology Screening  Never done  . PAP SMEAR-Modifier  Never done    There are no preventive care reminders to display for this patient.  Lab Results  Component Value Date   TSH 0.58 05/15/2019   Lab Results  Component Value Date   WBC 4.9 05/15/2019   HGB 12.2 05/15/2019   HCT 36.7 05/15/2019   MCV 91.5 05/15/2019   PLT 177 05/15/2019   Lab Results  Component Value Date   NA 139 04/02/2018   K 3.5 04/02/2018   CO2 27 04/02/2018   GLUCOSE 94 04/02/2018   BUN 6 04/02/2018   CREATININE 0.81 04/02/2018   BILITOT 0.7 12/08/2009   ALKPHOS 164 12/08/2009   AST 19 12/08/2009   ALT 13 12/08/2009   PROT 7.5 12/08/2009   ALBUMIN 4.4 12/08/2009   CALCIUM 9.5 04/02/2018   ANIONGAP 9 04/02/2018   No results found for: CHOL No results found for: HDL No results found for: LDLCALC No results found for: TRIG No results found for: CHOLHDL No results found for: XUXY3F    Assessment & Plan:   Problem List Items Addressed This Visit      Nervous and Auditory   Labyrinthitis     Other   Paresthesias   Viral syndrome - Primary      No orders of the defined types were placed in this encounter.   Follow-up: Return Will go for Covid testing and follow up with primary or to ER if signs and symptoms are worse..   Suspect viral syndrome and possibly Covid.Will go for Covid testing. Consider Covid fingers? Consider RSD, cervical radiculopathy?  Not certain here. Will obviously need further work up if symptoms persist.  Follow up with Dr. Jarome Matin, MD

## 2020-03-09 ENCOUNTER — Ambulatory Visit: Payer: 59 | Admitting: Family Medicine

## 2020-07-04 ENCOUNTER — Ambulatory Visit: Payer: 59 | Admitting: Family

## 2020-07-04 ENCOUNTER — Other Ambulatory Visit: Payer: Self-pay

## 2020-07-05 ENCOUNTER — Ambulatory Visit: Payer: 59 | Admitting: Family

## 2020-07-05 ENCOUNTER — Encounter: Payer: Self-pay | Admitting: Family

## 2020-07-05 VITALS — BP 104/68 | HR 78 | Temp 97.9°F | Ht 62.0 in | Wt 111.0 lb

## 2020-07-05 DIAGNOSIS — R002 Palpitations: Secondary | ICD-10-CM | POA: Diagnosis not present

## 2020-07-05 DIAGNOSIS — R0789 Other chest pain: Secondary | ICD-10-CM

## 2020-07-05 DIAGNOSIS — R638 Other symptoms and signs concerning food and fluid intake: Secondary | ICD-10-CM

## 2020-07-05 DIAGNOSIS — Z8679 Personal history of other diseases of the circulatory system: Secondary | ICD-10-CM | POA: Diagnosis not present

## 2020-07-05 DIAGNOSIS — R399 Unspecified symptoms and signs involving the genitourinary system: Secondary | ICD-10-CM

## 2020-07-05 LAB — COMPREHENSIVE METABOLIC PANEL
ALT: 8 U/L (ref 0–35)
AST: 11 U/L (ref 0–37)
Albumin: 4.7 g/dL (ref 3.5–5.2)
Alkaline Phosphatase: 38 U/L — ABNORMAL LOW (ref 39–117)
BUN: 11 mg/dL (ref 6–23)
CO2: 27 mEq/L (ref 19–32)
Calcium: 9.4 mg/dL (ref 8.4–10.5)
Chloride: 105 mEq/L (ref 96–112)
Creatinine, Ser: 0.71 mg/dL (ref 0.40–1.20)
GFR: 120.66 mL/min (ref >60.00)
Glucose, Bld: 91 mg/dL (ref 70–99)
Potassium: 4 mEq/L (ref 3.5–5.1)
Sodium: 137 mEq/L (ref 135–145)
Total Bilirubin: 0.5 mg/dL (ref 0.2–1.2)
Total Protein: 6.9 g/dL (ref 6.0–8.3)

## 2020-07-05 LAB — CBC WITH DIFFERENTIAL/PLATELET
Basophils Absolute: 0 10*3/uL (ref 0.0–0.1)
Basophils Relative: 0.4 % (ref 0.0–3.0)
Eosinophils Absolute: 0.1 10*3/uL (ref 0.0–0.7)
Eosinophils Relative: 3.6 % (ref 0.0–5.0)
HCT: 39.7 % (ref 36.0–46.0)
Hemoglobin: 13.5 g/dL (ref 12.0–15.0)
Lymphocytes Relative: 32.7 % (ref 12.0–46.0)
Lymphs Abs: 1.1 10*3/uL (ref 0.7–4.0)
MCHC: 34 g/dL (ref 30.0–36.0)
MCV: 89.4 fl (ref 78.0–100.0)
Monocytes Absolute: 0.3 10*3/uL (ref 0.1–1.0)
Monocytes Relative: 8.9 % (ref 3.0–12.0)
Neutro Abs: 1.8 10*3/uL (ref 1.4–7.7)
Neutrophils Relative %: 54.4 % (ref 43.0–77.0)
Platelets: 161 10*3/uL (ref 150.0–400.0)
RBC: 4.44 Mil/uL (ref 3.87–5.11)
RDW: 12.9 % (ref 11.5–15.5)
WBC: 3.3 10*3/uL — ABNORMAL LOW (ref 4.0–10.5)

## 2020-07-05 NOTE — Patient Instructions (Signed)

## 2020-07-05 NOTE — Progress Notes (Signed)
Acute Office Visit  Subjective:    Patient ID: Tara Mccarthy, female    DOB: [birth year], 24 y.o.   MRN: [id number]  Chief Complaint  Patient presents with  . 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 Cleared around 32 years old. Chest tender to touch sometimes.     HPI Patient is in today with concerns of heart palpitations that have occurred over the last year but recent episode last longer than usual (about 1 min). 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 She has not had any cardiac issue until recently. Admits to drinking 2 sodas a day. No chest pain, SOB, nausea or vomiting. Also concerned with the inability to gain weight, skin feeling sensitive to touch to her chest and back area, hair loss. She has a family history significant for hypethyroidism. Last Thyroid panel was 04/2019.   Past Medical History:  Diagnosis Date  . Migraine   . Nephrolithiasis     Past Surgical History:  Procedure Laterality Date  . APPENDECTOMY    . FOOT SURGERY Left    Turf toe- fixed the tendon  . TONSILLECTOMY      Family History  Problem Relation Age of Onset  . Nephrolithiasis Father   . Tremor Neg Hx     Social History   Socioeconomic History  . Marital status: Single    Spouse name: Not on file  . Number of children: 0  . Years of education: College  . Highest education level: Not on file  Occupational History  . Occupation: Ship broker    Comment: Automotive engineer  Tobacco Use  . Smoking status: Never Smoker  . Smokeless tobacco: Never Used  Substance and Sexual Activity  . Alcohol use: No  . Drug use: No  . Sexual activity: Not on file    Comment: couple of weeks ago  Other Topics Concern  . Not on file  Social History Narrative   Lives w/ mom and dad   Caffeine use: Multiple drinks per week (more than 5)   Social  Determinants of Health   Financial Resource Strain: Not on file  Food Insecurity: Not on file  Transportation Needs: Not on file  Physical Activity: Not on file  Stress: Not on file  Social Connections: Not on file  Intimate Partner Violence: Not on file    Outpatient Medications Prior to Visit  Medication Sig Dispense Refill  . desonide (DESOWEN) 0.05 % ointment Apply 1 application topically 2 (two) times daily as needed for rash. On mouth  0  . etonogestrel-ethinyl estradiol (NUVARING) 0.12-0.015 MG/24HR vaginal ring     . Acetaminophen (MIDOL PO) Take 1 tablet by mouth daily as needed (pain). (Patient not taking: Reported on 07/05/2020)    . etonogestrel (NEXPLANON) 68 MG IMPL implant 68 mg by Subdermal route continuous.  (Patient not taking: No sig reported)    . nystatin ointment (MYCOSTATIN) Apply 1 application topically 2 (two) times daily as needed (for flare ups).  (Patient not taking: Reported on 07/05/2020)  2   No facility-administered medications prior to visit.    Allergies  Allergen Reactions  . Ceftriaxone Rash    Review of Systems  Constitutional: Positive for fatigue.  HENT: Negative.   Respiratory: Negative.  Negative for chest tightness, shortness of breath and wheezing.  Cardiovascular: Positive for chest pain and palpitations. Negative for leg swelling.  Gastrointestinal: Negative.   Endocrine: Positive for cold intolerance.       Unable to gain weight  Genitourinary: Negative.   Musculoskeletal: Positive for back pain. Negative for myalgias.  Skin: Negative for rash.       Hair loss  Allergic/Immunologic: Negative.   Neurological: Positive for dizziness. Negative for syncope.  Hematological: Negative.   Psychiatric/Behavioral: The patient is nervous/anxious.   All other systems reviewed and are negative.      Objective:    Physical Exam Vitals (Mother present) reviewed.  Constitutional:      Appearance: Normal appearance.  HENT:     Head:  Normocephalic and atraumatic.     Right Ear: Tympanic membrane and ear canal normal.     Left Ear: Tympanic membrane and ear canal normal.  Cardiovascular:     Rate and Rhythm: Normal rate and regular rhythm.     Pulses: Normal pulses.     Heart sounds: Normal heart sounds.  Pulmonary:     Effort: Pulmonary effort is normal.     Breath sounds: Normal breath sounds.  Abdominal:     General: Bowel sounds are normal.     Palpations: Abdomen is soft.  Musculoskeletal:        General: Normal range of motion.     Cervical back: Normal range of motion and neck supple.  Skin:    General: Skin is warm and dry.  Neurological:     General: No focal deficit present.     Mental Status: She is alert and oriented to person, place, and time.  Psychiatric:        Mood and Affect: Mood normal.        Behavior: Behavior normal.     BP 104/68   Pulse 78   Temp 97.9 F (36.6 C) (Temporal)   Ht 5' 2"  (1.575 m)   Wt 111 lb (50.3 kg)   SpO2 98%   BMI 20.30 kg/m  Wt Readings from Last 3 Encounters:  07/05/20 111 lb (50.3 kg)  03/08/20 113 lb (51.3 kg)  05/15/19 107 lb 3.2 oz (48.6 kg)    Health Maintenance Due  Topic Date Due  . Hepatitis C Screening  Never done  . TETANUS/TDAP  Never done  . PAP-Cervical Cytology Screening  Never done  . PAP SMEAR-Modifier  Never done    There are no preventive care reminders to display for this patient.   Lab Results  Component Value Date   TSH 0.58 05/15/2019   Lab Results  Component Value Date   WBC 4.9 05/15/2019   HGB 12.2 05/15/2019   HCT 36.7 05/15/2019   MCV 91.5 05/15/2019   PLT 177 05/15/2019   Lab Results  Component Value Date   NA 139 04/02/2018   K 3.5 04/02/2018   CO2 27 04/02/2018   GLUCOSE 94 04/02/2018   BUN 6 04/02/2018   CREATININE 0.81 04/02/2018   BILITOT 0.7 12/08/2009   ALKPHOS 164 12/08/2009   AST 19 12/08/2009   ALT 13 12/08/2009   PROT 7.5 12/08/2009   ALBUMIN 4.4 12/08/2009   CALCIUM 9.5 04/02/2018    ANIONGAP 9 04/02/2018   No results found for: CHOL No results found for: HDL No results found for: LDLCALC No results found for: TRIG No results found for: CHOLHDL No results found for: HGBA1C     Assessment & Plan:   Problem List Items Addressed This Visit  None   Visit Diagnoses    Palpitation    -  Primary   Relevant Orders   EKG 12-Lead (Completed)   CBC w/Diff   Thyroid Panel With TSH   Antinuclear Antib (ANA)   Comp Met (CMET)   ECHOCARDIOGRAM COMPLETE   Chest wall pain       Relevant Orders   CBC w/Diff   Thyroid Panel With TSH   Antinuclear Antib (ANA)   Comp Met (CMET)   ECHOCARDIOGRAM COMPLETE   History of pulmonary valve disorder       Relevant Orders   CBC w/Diff   Thyroid Panel With TSH   Comp Met (CMET)   ECHOCARDIOGRAM COMPLETE   Weight disorder       Relevant Orders   CBC w/Diff   Thyroid Panel With TSH   Antinuclear Antib (ANA)   Comp Met (CMET)       Will follow-up pending labs and discuss further treatment plan.   Kennyth Arnold, FNP

## 2020-07-07 ENCOUNTER — Telehealth: Payer: Self-pay

## 2020-07-07 ENCOUNTER — Ambulatory Visit: Payer: 59 | Admitting: Family

## 2020-07-07 ENCOUNTER — Other Ambulatory Visit: Payer: Self-pay

## 2020-07-07 ENCOUNTER — Other Ambulatory Visit (INDEPENDENT_AMBULATORY_CARE_PROVIDER_SITE_OTHER): Payer: 59

## 2020-07-07 DIAGNOSIS — R399 Unspecified symptoms and signs involving the genitourinary system: Secondary | ICD-10-CM | POA: Diagnosis not present

## 2020-07-07 DIAGNOSIS — M549 Dorsalgia, unspecified: Secondary | ICD-10-CM

## 2020-07-07 LAB — POCT URINALYSIS DIPSTICK
Bilirubin, UA: NEGATIVE
Blood, UA: NEGATIVE
Glucose, UA: NEGATIVE
Ketones, UA: NEGATIVE
Leukocytes, UA: NEGATIVE
Nitrite, UA: NEGATIVE
Protein, UA: POSITIVE — AB
Spec Grav, UA: >=1.03 — AB (ref 1.010–1.025)
Urobilinogen, UA: 0.2 E.U./dL
pH, UA: 6 (ref 5.0–8.0)

## 2020-07-07 LAB — THYROID PANEL WITH TSH
Free Thyroxine Index: 2.6 (ref 1.4–3.8)
T3 Uptake: 29 % (ref 22–35)
T4, Total: 8.8 ug/dL (ref 5.1–11.9)
TSH: 1.38 mIU/L

## 2020-07-07 LAB — ANA: Anti Nuclear Antibody (ANA): NEGATIVE

## 2020-07-07 NOTE — Addendum Note (Signed)
Addended by: Varney Biles on: 07/07/2020 11:48 AM   Modules accepted: Orders

## 2020-07-07 NOTE — Addendum Note (Signed)
Addended by: Varney Biles on: 07/07/2020 11:50 AM   Modules accepted: Orders

## 2020-07-07 NOTE — Telephone Encounter (Signed)
Patient's mother called and informed us that the pt came to office this morning to leave a urine sample, pt woke up with exstream stabbing pain in her back and mom wanted to let us know and also to get suggestions on what the patient should do.  Mom is afraid that this may be a kidney stone.  Mom would like a call back as soon as someone can advise her and pt   CB# [phone].  I offered her a office visit today and she said that she would call back but also wanted me to send message back.

## 2020-07-08 LAB — URINE CULTURE
MICRO NUMBER:: 11353476
Result:: NO GROWTH
SPECIMEN QUALITY:: ADEQUATE

## 2020-07-13 ENCOUNTER — Ambulatory Visit (HOSPITAL_COMMUNITY): Admission: RE | Admit: 2020-07-13 | Payer: 59 | Source: Ambulatory Visit

## 2020-11-10 LAB — HM PAP SMEAR: HM Pap smear: NORMAL

## 2021-08-27 NOTE — Progress Notes (Signed)
BP 103/87    Pulse 76    Temp 97.6 F (36.4 C) (Oral)    Ht 5\' 2"  (1.575 m)    Wt 112 lb (50.8 kg)    LMP 08/26/2021 (Exact Date)    SpO2 99%    BMI 20.49 kg/m    Subjective:    Patient ID: Tara Mccarthy, female    DOB: Apr 30, 1996, 26 y.o.   MRN: [id number]  CC: Chief Complaint  Patient presents with   Transitions Of Care    Toc. Est care. Pt c/o chronic migraines for several months.    HPI: Tara Mccarthy is a 26 y.o. female presenting on 08/28/2021 for comprehensive medical examination. Current medical complaints include:none  She currently lives with: mom and dad, boyfriend Menopausal Symptoms: no  MIGRAINES  She had migraines frequently in high school and then they went away for a few years. Then, at the beginning of this month, she had a migraine. She also gets "light headaches" occasionally. Migraines she tends to get every 2-3 weeks. She takes excedrin migraine to help the pain. Endorses phonophobia and photophobia. Denies nausea. Denies aura.   Depression Screen done today and results listed below:  Depression screen [facility] 2/9 08/28/2021 07/05/2020 05/15/2019  Decreased Interest 0 0 0  Down, Depressed, Hopeless 0 0 0  PHQ - 2 Score 0 0 0  Altered sleeping - - 0  Tired, decreased energy - - 1  Change in appetite - - 0  Feeling bad or failure about yourself  - - 0  Trouble concentrating - - 0  Moving slowly or fidgety/restless - - 0  Suicidal thoughts - - 0  PHQ-9 Score - - 1  Difficult doing work/chores - - Not difficult at all    The patient does not have a history of falls. I did not complete a risk assessment for falls. A plan of care for falls was not documented.   Past Medical History:  Past Medical History:  Diagnosis Date   Migraine    Nephrolithiasis    Sepsis ([place]) 11/11/2016    Surgical History:  Past Surgical History:  Procedure Laterality Date   APPENDECTOMY     FOOT SURGERY Left    Turf toe- fixed the tendon   TONSILLECTOMY       Medications:  No current outpatient medications on file prior to visit.   No current facility-administered medications on file prior to visit.    Allergies:  Allergies  Allergen Reactions   Ceftriaxone Rash    Social History:  Social History   Socioeconomic History   Marital status: Single    Spouse name: Not on file   Number of children: 0   Years of education: College   Highest education level: Not on file  Occupational History   Occupation: student    Comment: Automotive engineer  Tobacco Use   Smoking status: Never   Smokeless tobacco: Never  Vaping Use   Vaping Use: Never used  Substance and Sexual Activity   Alcohol use: No   Drug use: No   Sexual activity: Not on file  Other Topics Concern   Not on file  Social History Narrative   Lives w/ mom and dad   Caffeine use: Multiple drinks per week (more than 5)   Social Determinants of Health   Financial Resource Strain: Not on file  Food Insecurity: Not on file  Transportation Needs: Not on file  Physical Activity: Not on file  Stress: Not  on file  Social Connections: Not on file  Intimate Partner Violence: Not on file   Social History   Tobacco Use  Smoking Status Never  Smokeless Tobacco Never   Social History   Substance and Sexual Activity  Alcohol Use No    Family History:  Family History  Problem Relation Age of Onset   Nephrolithiasis Father    Diabetes Maternal Grandfather    Cancer Paternal Grandmother        leukemia   Tremor Neg Hx     Past medical history, surgical history, medications, allergies, family history and social history reviewed with patient today and changes made to appropriate areas of the chart.   Review of Systems  Constitutional:  Positive for malaise/fatigue. Negative for fever.  HENT: Negative.    Eyes: Negative.   Respiratory: Negative.    Cardiovascular: Negative.   Gastrointestinal: Negative.   Genitourinary: Negative.   Musculoskeletal: Negative.    Skin: Negative.   Neurological: Negative.   Psychiatric/Behavioral: Negative.    All other ROS negative except what is listed above and in the HPI.      Objective:    BP 103/87    Pulse 76    Temp 97.6 F (36.4 C) (Oral)    Ht 5\' 2"  (1.575 m)    Wt 112 lb (50.8 kg)    LMP 08/26/2021 (Exact Date)    SpO2 99%    BMI 20.49 kg/m   Wt Readings from Last 3 Encounters:  08/28/21 112 lb (50.8 kg)  07/05/20 111 lb (50.3 kg)  03/08/20 113 lb (51.3 kg)    Physical Exam Vitals and nursing note reviewed.  Constitutional:      General: She is not in acute distress.    Appearance: Normal appearance.  HENT:     Head: Normocephalic and atraumatic.     Right Ear: Tympanic membrane, ear canal and external ear normal.     Left Ear: Tympanic membrane, ear canal and external ear normal.     Nose: Nose normal.     Mouth/Throat:     Mouth: Mucous membranes are moist.     Pharynx: Oropharynx is clear.  Eyes:     Conjunctiva/sclera: Conjunctivae normal.  Cardiovascular:     Rate and Rhythm: Normal rate and regular rhythm.     Pulses: Normal pulses.     Heart sounds: Normal heart sounds.  Pulmonary:     Effort: Pulmonary effort is normal.     Breath sounds: Normal breath sounds.  Abdominal:     General: Bowel sounds are normal.     Palpations: Abdomen is soft.     Tenderness: There is no abdominal tenderness.  Musculoskeletal:        General: Normal range of motion.     Cervical back: Normal range of motion. No tenderness.  Lymphadenopathy:     Cervical: No cervical adenopathy.  Skin:    General: Skin is warm and dry.  Neurological:     General: No focal deficit present.     Mental Status: She is alert and oriented to person, place, and time.     Cranial Nerves: No cranial nerve deficit.     Coordination: Coordination normal.     Gait: Gait normal.  Psychiatric:        Mood and Affect: Mood normal.        Behavior: Behavior normal.        Thought Content: Thought content normal.  Judgment: Judgment normal.    Results for orders placed or performed in visit on 07/07/20  Urine Culture   Specimen: Urine  Result Value Ref Range   MICRO NUMBER: [id number]    SPECIMEN QUALITY: Adequate    Sample Source NOT GIVEN    STATUS: FINAL    Result: No Growth   POCT Urinalysis Dipstick  Result Value Ref Range   Color, UA yellow    Clarity, UA cloudy    Glucose, UA Negative Negative   Bilirubin, UA negative    Ketones, UA negative    Spec Grav, UA >=1.030 (A) 1.010 - 1.025   Blood, UA negative    pH, UA 6.0 5.0 - 8.0   Protein, UA Positive (A) Negative   Urobilinogen, UA 0.2 0.2 or 1.0 E.U./dL   Nitrite, UA negative    Leukocytes, UA Negative Negative   Appearance     Odor        Assessment & Plan:   Problem List Items Addressed This Visit       Cardiovascular and Mediastinum   Migraine    Chronic, migraines have improved since high school. She endorses migraine headaches about every 2-3 weeks and excedrin will relieve the pain. Follow up if symptoms worsen or occur more frequently.       Other Visit Diagnoses     Routine general medical examination at a health care facility    -  Primary   Health maintenance reviewed. Follows with GYN, will request pap records. Labs reviewed from 06/2020. Will hold off on labs this year, check next year.    Need for Tdap vaccination       Tdap given today   Relevant Orders   Tdap vaccine greater than or equal to 7yo IM (Completed)        Follow up plan: Return in about 1 year (around 08/28/2022) for CPE.   LABORATORY TESTING:  - Pap smear: done elsewhere  IMMUNIZATIONS:   - Tdap: Tetanus vaccination status reviewed: Td vaccination indicated and given today. - Influenza: Refused - Pneumovax: Not applicable - Prevnar: Not applicable - HPV: Refused - Zostavax vaccine: Not applicable  SCREENING: -Mammogram: Not applicable  - Colonoscopy: Not applicable  - Bone Density: Not applicable  -Hearing Test: Not  applicable  -Spirometry: Not applicable   PATIENT COUNSELING:   Advised to take 1 mg of folate supplement per day if capable of pregnancy.   Sexuality: Discussed sexually transmitted diseases, partner selection, use of condoms, avoidance of unintended pregnancy  and contraceptive alternatives.   Advised to avoid cigarette smoking.  I discussed with the patient that most people either abstain from alcohol or drink within safe limits (<=14/week and <=4 drinks/occasion for males, <=7/weeks and <= 3 drinks/occasion for females) and that the risk for alcohol disorders and other health effects rises proportionally with the number of drinks per week and how often a drinker exceeds daily limits.  Discussed cessation/primary prevention of drug use and availability of treatment for abuse.   Diet: Encouraged to adjust caloric intake to maintain  or achieve ideal body weight, to reduce intake of dietary saturated fat and total fat, to limit sodium intake by avoiding high sodium foods and not adding table salt, and to maintain adequate dietary potassium and calcium preferably from fresh fruits, vegetables, and low-fat dairy products.    stressed the importance of regular exercise  Injury prevention: Discussed safety belts, safety helmets, smoke detector, smoking near bedding or upholstery.   Dental health:  Discussed importance of regular tooth brushing, flossing, and dental visits.    NEXT PREVENTATIVE PHYSICAL DUE IN 1 YEAR. Return in about 1 year (around 08/28/2022) for CPE.

## 2021-08-28 ENCOUNTER — Encounter: Payer: Self-pay | Admitting: Nurse Practitioner

## 2021-08-28 ENCOUNTER — Ambulatory Visit (INDEPENDENT_AMBULATORY_CARE_PROVIDER_SITE_OTHER): Payer: 59 | Admitting: Nurse Practitioner

## 2021-08-28 ENCOUNTER — Other Ambulatory Visit: Payer: Self-pay

## 2021-08-28 VITALS — BP 103/87 | HR 76 | Temp 97.6°F | Ht 62.0 in | Wt 112.0 lb

## 2021-08-28 DIAGNOSIS — Z23 Encounter for immunization: Secondary | ICD-10-CM

## 2021-08-28 DIAGNOSIS — Z0000 Encounter for general adult medical examination without abnormal findings: Secondary | ICD-10-CM

## 2021-08-28 DIAGNOSIS — G43009 Migraine without aura, not intractable, without status migrainosus: Secondary | ICD-10-CM

## 2021-08-28 NOTE — Assessment & Plan Note (Signed)
Chronic, migraines have improved since high school. She endorses migraine headaches about every 2-3 weeks and excedrin will relieve the pain. Follow up if symptoms worsen or occur more frequently.

## 2021-08-28 NOTE — Patient Instructions (Signed)
It was great to see you!  Keep up the great work!   Let's follow-up in 1 year, sooner if you have concerns.  If a referral was placed today, you will be contacted for an appointment. Please note that routine referrals can sometimes take up to 3-4 weeks to process. Please call our office if you haven't heard anything after this time frame.  Take care,  Hyder Deman, NP  

## 2021-09-05 ENCOUNTER — Encounter: Payer: Self-pay | Admitting: Nurse Practitioner

## 2021-12-15 ENCOUNTER — Encounter: Payer: Self-pay | Admitting: Nurse Practitioner

## 2021-12-15 ENCOUNTER — Ambulatory Visit: Payer: 59 | Admitting: Nurse Practitioner

## 2021-12-15 ENCOUNTER — Other Ambulatory Visit (HOSPITAL_COMMUNITY)
Admission: RE | Admit: 2021-12-15 | Discharge: 2021-12-15 | Disposition: A | Discharge status: Home / Self Care | Payer: 59 | Source: Ambulatory Visit | Attending: Nurse Practitioner | Admitting: Nurse Practitioner

## 2021-12-15 VITALS — BP 108/80 | HR 88 | Temp 98.4°F | Wt 112.4 lb

## 2021-12-15 DIAGNOSIS — R102 Pelvic and perineal pain: Secondary | ICD-10-CM | POA: Diagnosis not present

## 2021-12-15 LAB — CBC WITH DIFFERENTIAL/PLATELET
Absolute Monocytes: 432 cells/uL (ref 200–950)
Basophils Absolute: 9 cells/uL (ref 0–200)

## 2021-12-15 NOTE — Assessment & Plan Note (Signed)
Tara Mccarthy is here today after experiencing severe lower abdominal pain for about 5 minutes last night.  She states that she was curled up in fetal position and unable to move.  She did have a recent stomach virus recently last week.  Currently she is not having any pain, there is slight tenderness to right and left pelvic region with palpation.  She does have a history of appendectomy.  Concern for possible ovarian cyst/cyst rupture.  Her last menstrual period was 2 weeks ago.  We will check UA, urine pregnancy, gonorrhea chlamydia, CMP, CBC, pelvic ultrasound.  Discussed ER precautions.  Follow-up in 2 to 3 weeks or sooner with concerns.

## 2021-12-15 NOTE — Progress Notes (Signed)
Acute Office Visit  Subjective:     Patient ID: Tara Mccarthy, female    DOB: Oct 14, 1995, 26 y.o.   MRN: [id number]  Chief Complaint  Patient presents with   Abdominal Pain    Pt c/o severe pain w/ feeling of muscle tightening, burning sensation and bloating in lower umbilical area for several days. Pain level ranges 5-10    HPI Patient is in today for abdominal pain. About a week ago, she had a stomach bug and diarrhea. She made sure she was staying hydrated. Since then she has been having bloating in her lower abdomen. Then last night around midnight, she had severe pain and felt like her stomach was hard as a rock, right under her belly button. She states she was in a fetal position and movement made the pain intensify. This lasted about 5 minutes. She states that the pain is better today, it is just tender to touch. Her last menstrual period was about 2 weeks ago. She denies fevers and vomiting. She did have some nausea with the intense pain. Denies changes in her diet, new foods.   ROS See pertinent positives and negatives per HPI.     Objective:    BP 108/80   Pulse 88   Temp 98.4 F (36.9 C) (Temporal)   Wt 112 lb 6.4 oz (51 kg)   BMI 20.56 kg/m    Physical Exam Vitals and nursing note reviewed.  Constitutional:      General: She is not in acute distress.    Appearance: Normal appearance.  HENT:     Head: Normocephalic.  Eyes:     Conjunctiva/sclera: Conjunctivae normal.  Cardiovascular:     Rate and Rhythm: Normal rate and regular rhythm.     Pulses: Normal pulses.     Heart sounds: Normal heart sounds.  Pulmonary:     Effort: Pulmonary effort is normal.     Breath sounds: Normal breath sounds.  Abdominal:     General: Abdomen is flat. There is no distension.     Palpations: Abdomen is soft.     Tenderness: There is abdominal tenderness in the right lower quadrant and left lower quadrant. There is no guarding or rebound. Negative signs include Murphy's  sign and McBurney's sign.     Hernia: No hernia is present.  Musculoskeletal:     Cervical back: Normal range of motion.  Skin:    General: Skin is warm.  Neurological:     General: No focal deficit present.     Mental Status: She is alert and oriented to person, place, and time.  Psychiatric:        Mood and Affect: Mood normal.        Behavior: Behavior normal.        Thought Content: Thought content normal.        Judgment: Judgment normal.    Results for orders placed or performed in visit on 12/15/21  CBC with Differential/Platelet  Result Value Ref Range   WBC 4.7 3.8 - 10.8 Thousand/uL   RBC 4.45 3.80 - 5.10 Million/uL   Hemoglobin 13.3 11.7 - 15.5 g/dL   HCT 32.9 92.4 - 26.8 %   MCV 89.4 80.0 - 100.0 fL   MCH 29.9 27.0 - 33.0 pg   MCHC 33.4 32.0 - 36.0 g/dL   RDW 34.1 96.2 - 22.9 %   Platelets 211 140 - 400 Thousand/uL   MPV 12.4 7.5 - 12.5 fL   Neutro Abs 2,726  1,500 - 7,800 cells/uL   Lymphs Abs 1,401 850 - 3,900 cells/uL   Absolute Monocytes 432 200 - 950 cells/uL   Eosinophils Absolute 132 15 - 500 cells/uL   Basophils Absolute 9 0 - 200 cells/uL   Neutrophils Relative % 58 %   Total Lymphocyte 29.8 %   Monocytes Relative 9.2 %   Eosinophils Relative 2.8 %   Basophils Relative 0.2 %  Comprehensive metabolic panel  Result Value Ref Range   Glucose, Bld 86 65 - 99 mg/dL   BUN 9 7 - 25 mg/dL   Creat 5.39 7.67 - 3.41 mg/dL   BUN/Creatinine Ratio NOT APPLICABLE 6 - 22 (calc)   Sodium 138 135 - 146 mmol/L   Potassium 3.7 3.5 - 5.3 mmol/L   Chloride 105 98 - 110 mmol/L   CO2 25 20 - 32 mmol/L   Calcium 9.6 8.6 - 10.2 mg/dL   Total Protein 6.8 6.1 - 8.1 g/dL   Albumin 4.8 3.6 - 5.1 g/dL   Globulin 2.0 1.9 - 3.7 g/dL (calc)   AG Ratio 2.4 1.0 - 2.5 (calc)   Total Bilirubin 0.8 0.2 - 1.2 mg/dL   Alkaline phosphatase (APISO) 44 31 - 125 U/L   AST 21 10 - 30 U/L   ALT 11 6 - 29 U/L        Assessment & Plan:   Problem List Items Addressed This Visit        Other   Pelvic pain - Primary    Tara Mccarthy is here today after experiencing severe lower abdominal pain for about 5 minutes last night.  She states that she was curled up in fetal position and unable to move.  She did have a recent stomach virus recently last week.  Currently she is not having any pain, there is slight tenderness to right and left pelvic region with palpation.  She does have a history of appendectomy.  Concern for possible ovarian cyst/cyst rupture.  Her last menstrual period was 2 weeks ago.  We will check UA, urine pregnancy, gonorrhea chlamydia, CMP, CBC, pelvic ultrasound.  Discussed ER precautions.  Follow-up in 2 to 3 weeks or sooner with concerns.       Relevant Orders   CBC with Differential/Platelet (Completed)   Comprehensive metabolic panel (Completed)   Urine cytology ancillary only   US Pelvic Complete With Transvaginal   POCT urinalysis dipstick   POCT urine pregnancy    No orders of the defined types were placed in this encounter.   Return in 2 weeks (on 12/29/2021) for 2-3 weeks abdominal pain.  Gerre Scull, NP

## 2021-12-15 NOTE — Patient Instructions (Signed)
It was great to see you!  We are checking your labs today and will let you know the results via mychart/phone.   I am ordering an ultrasound, they should call to schedule this.   Make sure you are drinking plenty of fluids. You can also try gas-x for bloating  If the pain comes back, go to the ER  Let's follow-up in 2-3 weeks, sooner if you have concerns.  If a referral was placed today, you will be contacted for an appointment. Please note that routine referrals can sometimes take up to 3-4 weeks to process. Please call our office if you haven't heard anything after this time frame.  Take care,  Rodman Pickle, NP

## 2021-12-16 LAB — COMPREHENSIVE METABOLIC PANEL
AG Ratio: 2.4 (calc) (ref 1.0–2.5)
ALT: 11 U/L (ref 6–29)
AST: 21 U/L (ref 10–30)
Albumin: 4.8 g/dL (ref 3.6–5.1)
Alkaline phosphatase (APISO): 44 U/L (ref 31–125)
BUN: 9 mg/dL (ref 7–25)
CO2: 25 mmol/L (ref 20–32)
Calcium: 9.6 mg/dL (ref 8.6–10.2)
Chloride: 105 mmol/L (ref 98–110)
Creat: 0.73 mg/dL (ref 0.50–0.96)
Globulin: 2 g/dL (calc) (ref 1.9–3.7)
Glucose, Bld: 86 mg/dL (ref 65–99)
Potassium: 3.7 mmol/L (ref 3.5–5.3)
Sodium: 138 mmol/L (ref 135–146)
Total Bilirubin: 0.8 mg/dL (ref 0.2–1.2)
Total Protein: 6.8 g/dL (ref 6.1–8.1)

## 2021-12-16 LAB — CBC WITH DIFFERENTIAL/PLATELET
Basophils Relative: 0.2 %
Eosinophils Absolute: 132 cells/uL (ref 15–500)
Eosinophils Relative: 2.8 %
HCT: 39.8 % (ref 35.0–45.0)
Hemoglobin: 13.3 g/dL (ref 11.7–15.5)
Lymphs Abs: 1401 cells/uL (ref 850–3900)
MCH: 29.9 pg (ref 27.0–33.0)
MCHC: 33.4 g/dL (ref 32.0–36.0)
MCV: 89.4 fL (ref 80.0–100.0)
MPV: 12.4 fL (ref 7.5–12.5)
Monocytes Relative: 9.2 %
Neutro Abs: 2726 cells/uL (ref 1500–7800)
Neutrophils Relative %: 58 %
Platelets: 211 10*3/uL (ref 140–400)
RBC: 4.45 10*6/uL (ref 3.80–5.10)
RDW: 13 % (ref 11.0–15.0)
Total Lymphocyte: 29.8 %
WBC: 4.7 10*3/uL (ref 3.8–10.8)

## 2021-12-18 LAB — URINE CYTOLOGY ANCILLARY ONLY
Chlamydia: NEGATIVE
Comment: NEGATIVE
Comment: NORMAL
Neisseria Gonorrhea: NEGATIVE

## 2021-12-19 ENCOUNTER — Ambulatory Visit
Admission: RE | Admit: 2021-12-19 | Discharge: 2021-12-19 | Disposition: A | Discharge status: Home / Self Care | Payer: 59 | Source: Ambulatory Visit | Attending: Nurse Practitioner | Admitting: Nurse Practitioner

## 2021-12-19 DIAGNOSIS — R102 Pelvic and perineal pain: Secondary | ICD-10-CM

## 2021-12-19 NOTE — Progress Notes (Signed)
Called and informed patient of results and provider instructions. Patient voiced understanding.

## 2022-08-02 ENCOUNTER — Telehealth: Payer: Self-pay | Admitting: Nurse Practitioner

## 2022-08-02 NOTE — Telephone Encounter (Signed)
Do you need see her first , please advise

## 2022-08-02 NOTE — Telephone Encounter (Signed)
Caller Name: Elmyra Ricks (Dalene Carrow), mother Call back phone #: [phone]  REFERRAL REQUEST Has patient seen PCP for this complaint? Yes.   08/2021  Referral for which specialty: Neurology Preferred provider/office: any Reason for referral: migraines frequently, concerned due to family history of aneurysm

## 2022-08-03 NOTE — Telephone Encounter (Signed)
Called and left message for patient call us back to make an appt with Ander Purpura

## 2022-08-03 NOTE — Telephone Encounter (Signed)
Called and spoke with patient informed pt that she will need an appt first before we can send a referral. Pt was agreeable to coming to an appt on next 08/06/22  to discuss referral to Neurology.

## 2022-08-06 ENCOUNTER — Telehealth: Payer: Self-pay | Admitting: Nurse Practitioner

## 2022-08-06 ENCOUNTER — Ambulatory Visit: Payer: 59 | Admitting: Nurse Practitioner

## 2022-08-06 NOTE — Telephone Encounter (Signed)
1.22.24 no show letter sent 

## 2022-08-07 NOTE — Telephone Encounter (Signed)
1st no show, fee waived, letter sent 

## 2022-08-07 NOTE — Telephone Encounter (Signed)
Noted  

## 2022-08-08 ENCOUNTER — Encounter: Payer: Self-pay | Admitting: Nurse Practitioner

## 2022-08-08 ENCOUNTER — Telehealth: Payer: Self-pay | Admitting: Nurse Practitioner

## 2022-08-08 ENCOUNTER — Ambulatory Visit: Payer: 59 | Admitting: Nurse Practitioner

## 2022-08-08 NOTE — Telephone Encounter (Signed)
noted 

## 2022-08-08 NOTE — Telephone Encounter (Signed)
1.24.24 no show letter sent 

## 2022-08-08 NOTE — Telephone Encounter (Signed)
No show 08/06/2022 and 08/08/2022 - final warning sent via mail - fee generated

## 2022-10-17 NOTE — Progress Notes (Unsigned)
   Acute Office Visit  Subjective:     Patient ID: Tara Mccarthy, female    DOB: [birth year], 27 y.o.   MRN: [id number]  No chief complaint on file.   HPI Patient is in today for fatigue.   ROS      Objective:    There were no vitals taken for this visit. {Vitals History (Optional):23777}  Physical Exam  No results found for any visits on 10/18/22.      Assessment & Plan:   Problem List Items Addressed This Visit   None   No orders of the defined types were placed in this encounter.   No follow-ups on file.  Charyl Dancer, NP

## 2022-10-18 ENCOUNTER — Ambulatory Visit: Payer: 59 | Admitting: Nurse Practitioner

## 2022-10-18 ENCOUNTER — Encounter: Payer: Self-pay | Admitting: Nurse Practitioner

## 2022-10-18 VITALS — BP 100/62 | HR 66 | Temp 97.9°F | Ht 62.0 in | Wt 114.8 lb

## 2022-10-18 DIAGNOSIS — E875 Hyperkalemia: Secondary | ICD-10-CM | POA: Diagnosis not present

## 2022-10-18 DIAGNOSIS — R5383 Other fatigue: Secondary | ICD-10-CM | POA: Diagnosis not present

## 2022-10-18 DIAGNOSIS — Z1159 Encounter for screening for other viral diseases: Secondary | ICD-10-CM | POA: Diagnosis not present

## 2022-10-18 LAB — CBC WITH DIFFERENTIAL/PLATELET
Basophils Absolute: 0 10*3/uL (ref 0.0–0.1)
Basophils Relative: 0.2 % (ref 0.0–3.0)
Eosinophils Absolute: 0.2 10*3/uL (ref 0.0–0.7)
Eosinophils Relative: 2.7 % (ref 0.0–5.0)
HCT: 41.4 % (ref 36.0–46.0)
Hemoglobin: 14.2 g/dL (ref 12.0–15.0)
Lymphocytes Relative: 27 % (ref 12.0–46.0)
Lymphs Abs: 1.5 10*3/uL (ref 0.7–4.0)
MCHC: 34.3 g/dL (ref 30.0–36.0)
MCV: 90.7 fl (ref 78.0–100.0)
Monocytes Absolute: 0.5 10*3/uL (ref 0.1–1.0)
Monocytes Relative: 8.2 % (ref 3.0–12.0)
Neutro Abs: 3.4 10*3/uL (ref 1.4–7.7)
Neutrophils Relative %: 61.9 % (ref 43.0–77.0)
Platelets: 178 10*3/uL (ref 150.0–400.0)
RBC: 4.57 Mil/uL (ref 3.87–5.11)
RDW: 13.6 % (ref 11.5–15.5)
WBC: 5.5 10*3/uL (ref 4.0–10.5)

## 2022-10-18 LAB — COMPREHENSIVE METABOLIC PANEL
ALT: 9 U/L (ref 0–35)
AST: 13 U/L (ref 0–37)
Albumin: 4.9 g/dL (ref 3.5–5.2)
Alkaline Phosphatase: 48 U/L (ref 39–117)
BUN: 13 mg/dL (ref 6–23)
CO2: 27 mEq/L (ref 19–32)
Calcium: 10 mg/dL (ref 8.4–10.5)
Chloride: 106 mEq/L (ref 96–112)
Creatinine, Ser: 0.74 mg/dL (ref 0.40–1.20)
GFR: 112.98 mL/min (ref >60.00)
Glucose, Bld: 94 mg/dL (ref 70–99)
Potassium: 5.9 mEq/L — ABNORMAL HIGH (ref 3.5–5.1)
Sodium: 142 mEq/L (ref 135–145)
Total Bilirubin: 0.5 mg/dL (ref 0.2–1.2)
Total Protein: 6.7 g/dL (ref 6.0–8.3)

## 2022-10-18 LAB — VITAMIN B12: Vitamin B-12: 152 pg/mL — ABNORMAL LOW (ref 211–911)

## 2022-10-18 LAB — TSH: TSH: 1.04 u[IU]/mL (ref 0.35–5.50)

## 2022-10-18 LAB — VITAMIN D 25 HYDROXY (VIT D DEFICIENCY, FRACTURES): VITD: 83.13 ng/mL (ref 30.00–100.00)

## 2022-10-18 NOTE — Addendum Note (Signed)
Addended by: Vance Peper A on: 10/18/2022 04:55 PM   Modules accepted: Orders

## 2022-10-18 NOTE — Patient Instructions (Signed)
It was great to see you!  We are checking your labs today and will let you know the results via mychart/phone.   Keep exercising regularly, and drinking fluids.   Let's follow-up based on your labs results.   Take care,  Vance Peper, NP

## 2022-10-19 ENCOUNTER — Other Ambulatory Visit (INDEPENDENT_AMBULATORY_CARE_PROVIDER_SITE_OTHER): Payer: 59

## 2022-10-19 ENCOUNTER — Telehealth: Payer: Self-pay | Admitting: Nurse Practitioner

## 2022-10-19 DIAGNOSIS — E875 Hyperkalemia: Secondary | ICD-10-CM

## 2022-10-19 LAB — BASIC METABOLIC PANEL
BUN: 9 mg/dL (ref 6–23)
CO2: 27 mEq/L (ref 19–32)
Calcium: 9.4 mg/dL (ref 8.4–10.5)
Chloride: 104 mEq/L (ref 96–112)
Creatinine, Ser: 0.73 mg/dL (ref 0.40–1.20)
GFR: 114.84 mL/min (ref >60.00)
Glucose, Bld: 95 mg/dL (ref 70–99)
Potassium: 4 mEq/L (ref 3.5–5.1)
Sodium: 137 mEq/L (ref 135–145)

## 2022-10-19 LAB — IRON,TIBC AND FERRITIN PANEL
%SAT: 30 % (calc) (ref 16–45)
Ferritin: 21 ng/mL (ref 16–154)
Iron: 90 ug/dL (ref 40–190)
TIBC: 302 mcg/dL (calc) (ref 250–450)

## 2022-10-19 LAB — HEPATITIS C ANTIBODY: Hepatitis C Ab: NONREACTIVE

## 2022-10-19 LAB — ANA W/REFLEX: Anti Nuclear Antibody (ANA): NEGATIVE

## 2022-10-19 NOTE — Telephone Encounter (Signed)
LVM to return call.

## 2022-10-19 NOTE — Telephone Encounter (Signed)
Pt is wanting a cb concerning her most recent lab results. Please advise pt at [phone]

## 2022-10-19 NOTE — Telephone Encounter (Signed)
Patient concerned with platelets being a low normal.  She said that her grandmother recently passed with Leukemia and she is very concerned.  She also wanted to know if she should come in for B12 injections or just take the OTC supplement as advised.

## 2022-10-22 NOTE — Telephone Encounter (Signed)
Patient notified of below message and no further concerns or questions.

## 2022-10-30 ENCOUNTER — Other Ambulatory Visit: Payer: Self-pay | Admitting: Nurse Practitioner

## 2022-10-30 ENCOUNTER — Ambulatory Visit (INDEPENDENT_AMBULATORY_CARE_PROVIDER_SITE_OTHER): Payer: 59

## 2022-10-30 DIAGNOSIS — E538 Deficiency of other specified B group vitamins: Secondary | ICD-10-CM | POA: Diagnosis not present

## 2022-10-30 MED ORDER — CYANOCOBALAMIN 1000 MCG/ML IJ SOLN
1000.0000 ug | INTRAMUSCULAR | Status: AC
  Administered 2022-10-30 – 2022-12-04 (×2): 1000 ug via INTRAMUSCULAR

## 2022-10-30 NOTE — Progress Notes (Signed)
After obtaining consent, and per orders of Lauren McElwee,NP injection of B-12 given IM Left Deltoid by Doree Barthel. Patient instructed to remain in clinic for 20 minutes afterwards, and to report any adverse reaction to me immediately.

## 2022-10-30 NOTE — Progress Notes (Signed)
Vitamin B12 low, she has trouble remember pills. Will schedule for 3 vitamin B12 injections every 2 weeks.

## 2022-11-13 ENCOUNTER — Ambulatory Visit (INDEPENDENT_AMBULATORY_CARE_PROVIDER_SITE_OTHER): Payer: 59

## 2022-11-13 VITALS — BP 110/60 | HR 72 | Temp 98.0°F | Resp 16 | Ht 62.0 in | Wt 112.0 lb

## 2022-11-13 DIAGNOSIS — E538 Deficiency of other specified B group vitamins: Secondary | ICD-10-CM

## 2022-11-13 MED ORDER — CYANOCOBALAMIN 1000 MCG/ML IJ SOLN
1000.0000 ug | Freq: Once | INTRAMUSCULAR | Status: AC
  Administered 2022-11-13: 1000 ug via INTRAMUSCULAR

## 2022-11-14 ENCOUNTER — Telehealth: Payer: Self-pay

## 2022-11-14 NOTE — Progress Notes (Signed)
After obtaining consent, and per orders of Lauren McElwee, injection of b12 given at left deltoid by Olga Coaster. Patient instructed to remain in clinic for 20 minutes afterwards, and to report any adverse reaction to me immediately.

## 2022-11-14 NOTE — Telephone Encounter (Signed)
After obtaining consent, and per orders of Lauren McElwee, injection of Cyanocobalamin given by Olga Coaster. Patient instructed to remain in clinic for 20 minutes afterwards, and to report any adverse reaction to me immediately.

## 2022-11-27 ENCOUNTER — Ambulatory Visit: Payer: 59

## 2022-11-28 ENCOUNTER — Ambulatory Visit: Payer: 59

## 2022-12-04 ENCOUNTER — Ambulatory Visit (INDEPENDENT_AMBULATORY_CARE_PROVIDER_SITE_OTHER): Payer: 59

## 2022-12-04 DIAGNOSIS — E538 Deficiency of other specified B group vitamins: Secondary | ICD-10-CM

## 2022-12-04 NOTE — Progress Notes (Signed)
Patient presents for  b12 injection per Donella Stade. Injection placed in LEFT DELTOID region. Patient tolerated procedure well with no concerns.    During the visit patient mentioned frequent migraines and if she could get some type of imaging. I advised her that Leotis Shames would need to see in office to assess her. She verbalized understanding and stated she would follow up with a mychart message.

## 2022-12-11 ENCOUNTER — Ambulatory Visit: Payer: 59

## 2022-12-13 ENCOUNTER — Ambulatory Visit: Payer: 59

## 2023-02-13 LAB — OB RESULTS CONSOLE ANTIBODY SCREEN: Antibody Screen: NEGATIVE

## 2023-02-13 LAB — CBC AND DIFFERENTIAL: Platelets: 195 10*3/uL (ref 150–400)

## 2023-02-13 LAB — OB RESULTS CONSOLE RPR: RPR: NONREACTIVE

## 2023-02-13 LAB — OB RESULTS CONSOLE GC/CHLAMYDIA: Chlamydia: NEGATIVE

## 2023-02-13 LAB — OB RESULTS CONSOLE HEPATITIS B SURFACE ANTIGEN: Hepatitis B Surface Ag: NEGATIVE

## 2023-02-13 LAB — HIV ANTIBODY (ROUTINE TESTING W REFLEX): HIV 1&2 Ab, 4th Generation: NONREACTIVE

## 2023-02-13 LAB — OB RESULTS CONSOLE RUBELLA ANTIBODY, IGM: Rubella: IMMUNE

## 2023-02-13 LAB — HEPATITIS C ANTIBODY: HCV Ab: NEGATIVE

## 2023-03-05 LAB — OB RESULTS CONSOLE GC/CHLAMYDIA
Chlamydia: NEGATIVE
Neisseria Gonorrhea: NEGATIVE

## 2023-03-14 ENCOUNTER — Inpatient Hospital Stay (HOSPITAL_COMMUNITY)
Admission: AD | Admit: 2023-03-14 | Discharge: 2023-03-14 | Disposition: A | Discharge status: Home / Self Care | Payer: 59 | Attending: Family Medicine | Admitting: Family Medicine

## 2023-03-14 ENCOUNTER — Encounter (HOSPITAL_COMMUNITY): Payer: Self-pay | Admitting: *Deleted

## 2023-03-14 DIAGNOSIS — O26891 Other specified pregnancy related conditions, first trimester: Secondary | ICD-10-CM

## 2023-03-14 DIAGNOSIS — Z711 Person with feared health complaint in whom no diagnosis is made: Secondary | ICD-10-CM | POA: Diagnosis not present

## 2023-03-14 DIAGNOSIS — Z041 Encounter for examination and observation following transport accident: Secondary | ICD-10-CM | POA: Diagnosis not present

## 2023-03-14 DIAGNOSIS — R0789 Other chest pain: Secondary | ICD-10-CM | POA: Diagnosis present

## 2023-03-14 DIAGNOSIS — R1032 Left lower quadrant pain: Secondary | ICD-10-CM | POA: Diagnosis present

## 2023-03-14 DIAGNOSIS — Z3A12 12 weeks gestation of pregnancy: Secondary | ICD-10-CM | POA: Insufficient documentation

## 2023-03-14 DIAGNOSIS — M25552 Pain in left hip: Secondary | ICD-10-CM | POA: Diagnosis present

## 2023-03-14 HISTORY — DX: Cardiac murmur, unspecified: R01.1

## 2023-03-14 HISTORY — DX: Unspecified asthma, uncomplicated: J45.909
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
## 2023-04-01 ENCOUNTER — Encounter: Payer: Self-pay | Admitting: Nurse Practitioner

## 2023-08-09 ENCOUNTER — Inpatient Hospital Stay (HOSPITAL_COMMUNITY)
Admission: AD | Admit: 2023-08-09 | Discharge: 2023-08-09 | Disposition: A | Discharge status: Home / Self Care | Payer: 59 | Attending: Obstetrics and Gynecology | Admitting: Obstetrics and Gynecology

## 2023-08-09 ENCOUNTER — Inpatient Hospital Stay (HOSPITAL_COMMUNITY): Payer: 59

## 2023-08-09 ENCOUNTER — Encounter (HOSPITAL_COMMUNITY): Payer: Self-pay | Admitting: Obstetrics and Gynecology

## 2023-08-09 DIAGNOSIS — O26893 Other specified pregnancy related conditions, third trimester: Secondary | ICD-10-CM

## 2023-08-09 DIAGNOSIS — N200 Calculus of kidney: Secondary | ICD-10-CM | POA: Diagnosis not present

## 2023-08-09 DIAGNOSIS — Z3A33 33 weeks gestation of pregnancy: Secondary | ICD-10-CM | POA: Insufficient documentation

## 2023-08-09 DIAGNOSIS — O26833 Pregnancy related renal disease, third trimester: Secondary | ICD-10-CM | POA: Insufficient documentation

## 2023-08-09 LAB — URINALYSIS, ROUTINE W REFLEX MICROSCOPIC
Bilirubin Urine: NEGATIVE
Glucose, UA: NEGATIVE mg/dL
Ketones, ur: NEGATIVE mg/dL
Leukocytes,Ua: NEGATIVE
Nitrite: NEGATIVE
Protein, ur: NEGATIVE mg/dL
RBC / HPF: >50 RBC/hpf (ref 0–5)
Specific Gravity, Urine: 1.014 (ref 1.005–1.030)
pH: 6 (ref 5.0–8.0)

## 2023-08-09 LAB — CBC WITH DIFFERENTIAL/PLATELET
Abs Immature Granulocytes: 0.25 10*3/uL — ABNORMAL HIGH (ref 0.00–0.07)
Basophils Absolute: 0 10*3/uL (ref 0.0–0.1)
Basophils Relative: 0 %
Eosinophils Absolute: 0.1 10*3/uL (ref 0.0–0.5)
Eosinophils Relative: 1 %
HCT: 29.8 % — ABNORMAL LOW (ref 36.0–46.0)
Hemoglobin: 10.3 g/dL — ABNORMAL LOW (ref 12.0–15.0)
Immature Granulocytes: 2 %
Lymphocytes Relative: 10 %
Lymphs Abs: 1.1 10*3/uL (ref 0.7–4.0)
MCH: 30.7 pg (ref 26.0–34.0)
MCHC: 34.6 g/dL (ref 30.0–36.0)
MCV: 88.7 fL (ref 80.0–100.0)
Monocytes Absolute: 0.9 10*3/uL (ref 0.1–1.0)
Monocytes Relative: 8 %
Neutro Abs: 9 10*3/uL — ABNORMAL HIGH (ref 1.7–7.7)
Neutrophils Relative %: 79 %
Platelets: 122 10*3/uL — ABNORMAL LOW (ref 150–400)
RBC: 3.36 MIL/uL — ABNORMAL LOW (ref 3.87–5.11)
RDW: 12.7 % (ref 11.5–15.5)
WBC: 11.4 10*3/uL — ABNORMAL HIGH (ref 4.0–10.5)
nRBC: 0 % (ref 0.0–0.2)

## 2023-08-09 LAB — COMPREHENSIVE METABOLIC PANEL
ALT: 12 U/L (ref 0–44)
AST: 15 U/L (ref 15–41)
Albumin: 3.1 g/dL — ABNORMAL LOW (ref 3.5–5.0)
Alkaline Phosphatase: 67 U/L (ref 38–126)
Anion gap: 10 (ref 5–15)
BUN: 8 mg/dL (ref 6–20)
CO2: 21 mmol/L — ABNORMAL LOW (ref 22–32)
Calcium: 8.6 mg/dL — ABNORMAL LOW (ref 8.9–10.3)
Chloride: 103 mmol/L (ref 98–111)
Creatinine, Ser: 0.7 mg/dL (ref 0.44–1.00)
GFR, Estimated: >60 mL/min (ref >60)
Glucose, Bld: 100 mg/dL — ABNORMAL HIGH (ref 70–99)
Potassium: 3.5 mmol/L (ref 3.5–5.1)
Sodium: 134 mmol/L — ABNORMAL LOW (ref 135–145)
Total Bilirubin: 0.4 mg/dL (ref 0.0–1.2)
Total Protein: 5.4 g/dL — ABNORMAL LOW (ref 6.5–8.1)

## 2023-08-09 MED ORDER — LACTATED RINGERS IV BOLUS
1000.0000 mL | Freq: Once | INTRAVENOUS | Status: AC
  Administered 2023-08-09: 1000 mL via INTRAVENOUS

## 2023-08-09 MED ORDER — ONDANSETRON 4 MG PO TBDP
4.0000 mg | ORAL_TABLET | Freq: Once | ORAL | Status: AC
  Administered 2023-08-09: 4 mg via ORAL
  Filled 2023-08-09: qty 1

## 2023-08-09 MED ORDER — OXYCODONE HCL 5 MG PO TABS: 5.0000 mg | ORAL_TABLET | Freq: Three times a day (TID) | ORAL | 0 refills | Status: DC | PRN

## 2023-08-09 MED ORDER — ONDANSETRON 4 MG PO TBDP: 4.0000 mg | ORAL_TABLET | Freq: Three times a day (TID) | ORAL | 0 refills | Status: DC | PRN

## 2023-08-09 MED ORDER — TAMSULOSIN HCL 0.4 MG PO CAPS: 0.4000 mg | ORAL_CAPSULE | Freq: Every day | ORAL | 0 refills | Status: DC

## 2023-08-09 MED ORDER — HYDROMORPHONE HCL 1 MG/ML IJ SOLN
1.0000 mg | Freq: Once | INTRAMUSCULAR | Status: AC
  Administered 2023-08-09: 1 mg via INTRAVENOUS
  Filled 2023-08-09: qty 1

## 2023-08-09 MED ORDER — MORPHINE SULFATE (PF) 4 MG/ML IV SOLN
2.0000 mg | INTRAVENOUS | Status: AC
  Administered 2023-08-09: 2 mg via INTRAVENOUS
  Filled 2023-08-09: qty 1

## 2023-08-09 MED ORDER — ONDANSETRON HCL 4 MG/2ML IJ SOLN
4.0000 mg | Freq: Once | INTRAMUSCULAR | Status: AC
  Administered 2023-08-09: 4 mg via INTRAVENOUS
  Filled 2023-08-09: qty 2

## 2023-08-09 MED ORDER — OXYCODONE HCL 5 MG PO TABS
10.0000 mg | ORAL_TABLET | ORAL | Status: AC
  Administered 2023-08-09: 10 mg via ORAL
  Filled 2023-08-09: qty 2

## 2023-08-09 MED ORDER — TAMSULOSIN HCL 0.4 MG PO CAPS
0.4000 mg | ORAL_CAPSULE | ORAL | Status: AC
  Administered 2023-08-09: 0.4 mg via ORAL
  Filled 2023-08-09: qty 1

## 2023-08-09 NOTE — MAU Note (Signed)
To u/s per wheelchair

## 2023-08-09 NOTE — MAU Note (Addendum)
...  KARLE DESROSIER is a 28 y.o. at [redacted]w[redacted]d here in MAU reporting: Posterior mid right back pain with sudden onset at 0900 this morning. She reports the pains are sharp, sudden, and shooting around to the right side of her abdomen. Has a history of kidney stones in both kidneys. Reports urinary frequency, discomfort with urination, and dribbling urine for the past 24 hours. Denies VB or LOF. +FM.   NPO: Solids last night.  Onset of complaint: 0900 this morning Pain score: 10/10 right mid to lower abdomen  Vitals:   08/09/23 1108  BP: 123/62  Pulse: 91  Resp: 20  Temp: 98.3 F (36.8 C)  SpO2: 100%     FHT: 165 initial external Lab orders placed from triage: UA

## 2023-08-09 NOTE — MAU Provider Note (Cosign Needed Addendum)
History     CSN: [id number]  Arrival date and time: 08/09/23 1056   Event Date/Time   First Provider Initiated Contact with Patient 08/09/2023 11:16 AM   Chief Complaint  Patient presents with   Back Pain   Abdominal Pain    HPI  Tara Mccarthy is a 28 y.o. G1P0 at [redacted]w[redacted]d who presents to the MAU for acute right sided flank pain. Acute onset around 0900, sharp, sudden, shooting to right side and into her back. Endorses chills, urinary frequency, dysuria, and incomplete voiding. She endorses chills, nausea, and vomiting. No VB, LOF, ctx. Has good FM. Known hx kidney stones, never requiring surgical intervention, current sxs feel similar to previous episodes.  Past Medical History:  Diagnosis Date   Asthma    Heart murmur    as child   Migraine    Nephrolithiasis    Sepsis (HCC) 11/11/2016    Past Surgical History:  Procedure Laterality Date   APPENDECTOMY     FOOT SURGERY Left    Turf toe- fixed the tendon   TONSILLECTOMY      Family History  Problem Relation Age of Onset   Healthy Mother    Nephrolithiasis Father    Diabetes Maternal Grandfather    Cancer Paternal Grandmother        leukemia   Tremor Neg Hx     Social History   Tobacco Use   Smoking status: Never   Smokeless tobacco: Never  Vaping Use   Vaping status: Never Used  Substance Use Topics   Alcohol use: No   Drug use: No    Allergies:  Allergies  Allergen Reactions   Ceftriaxone Rash    Medications Prior to Admission  Medication Sig Dispense Refill Last Dose/Taking   albuterol (VENTOLIN HFA) 108 (90 Base) MCG/ACT inhaler INHALE 2 PUFFS EVERY 4-6 HOURS AS NEEDED      Prenatal Vit-Fe Fumarate-FA (MULTIVITAMIN-PRENATAL) 27-0.8 MG TABS tablet Take 1 tablet by mouth daily at 12 noon.       ROS reviewed and pertinent positives and negatives as documented in HPI.  Physical Exam   Blood pressure (!) 110/48, pulse 98, temperature 98.3 F (36.8 C), temperature source Oral, resp. rate 20,  last menstrual period 09/21/2022, SpO2 100%.  Physical Exam Constitutional:      General: She is not in acute distress.    Appearance: Normal appearance.     Comments: uncomfortable  HENT:     Head: Normocephalic and atraumatic.  Cardiovascular:     Rate and Rhythm: Normal rate.  Pulmonary:     Effort: Pulmonary effort is normal.     Breath sounds: Normal breath sounds.  Abdominal:     General: There is no distension.     Palpations: Abdomen is soft.     Tenderness: There is no abdominal tenderness (neg RUQ or RLQ tenderness, more generalized and over right flank). There is no guarding or rebound.     Comments: Ttp right flank area  Musculoskeletal:        General: Normal range of motion.  Skin:    General: Skin is warm and dry.     Findings: No rash.  Neurological:     General: No focal deficit present.     Mental Status: She is alert and oriented to person, place, and time.   EFM: 120/mod/+a/-d  MAU Course  Procedures  MDM 28 y.o. G1P0 at [redacted]w[redacted]d presenting for right flank pain c/w previous episodes of nephrolithiasis. She is uncomfortable  but not ill appearing, abdominal exam benign, hemodynamically stable. Labs notable for hematuria, urine not infectious appearing,  CBC with elevated WBC with left shift, Cr at baseline (0.7). Obtaining renal U/S for further evaluation. Given Dilaudid and Zofran as well as 2L LR for symptoms.  1635 U/S back -- 4mm nonshadowing echogenicity at level of right renal pelvis -- likely calculus w mild right hydronephrosis. Urology paged for further recs.  1639 Case discussed with Dr. Ronne Binning, urology, who recommends Flomax, pain control w Roxicodone, and outpatient f/up with urology to get plugged into urology clinic. Pt updated w results -- reporting ongoing pain, will give dose of Roxicodone, Zofran, and Flomax now then discharge. Given return precautions. Rec straining urine.   Assessment and Plan  Right nephrolithiasis Per U/S UA not  concerning for UTI, will check cx to ensure no indication to tx given hx kidney stones Discussed case w urology - rec f/up outpatient, Flomax, pain control Rx for Roxicodone and Zofran sent Return precautions addressed   Tara Aland, MD OB Fellow, Faculty Practice [facility], [facility]  08/09/2023, 1:20 PM

## 2023-08-11 LAB — CULTURE, OB URINE: Culture: <10000 — AB

## 2023-08-26 IMAGING — US US PELVIS COMPLETE WITH TRANSVAGINAL
1 series · 14 of 25 positions shown · non-contrast
Comparison: None

CLINICAL DATA: Pelvic pain for 5 days, question ovarian cyst, LMP
12/17/2021

EXAM:
TRANSABDOMINAL AND TRANSVAGINAL ULTRASOUND OF PELVIS
TECHNIQUE: Both transabdominal and transvaginal ultrasound examinations of the
pelvis were performed. Transabdominal technique was performed for
global imaging of the pelvis including uterus, ovaries, adnexal
regions, and pelvic cul-de-sac. It was necessary to proceed with
endovaginal exam following the transabdominal exam to visualize the
endometrium and ovaries.

[Series 1: us pelvis complete with transvaginal · 0.17mm/px · 14 of 100 slices shown]
[im 1/100]
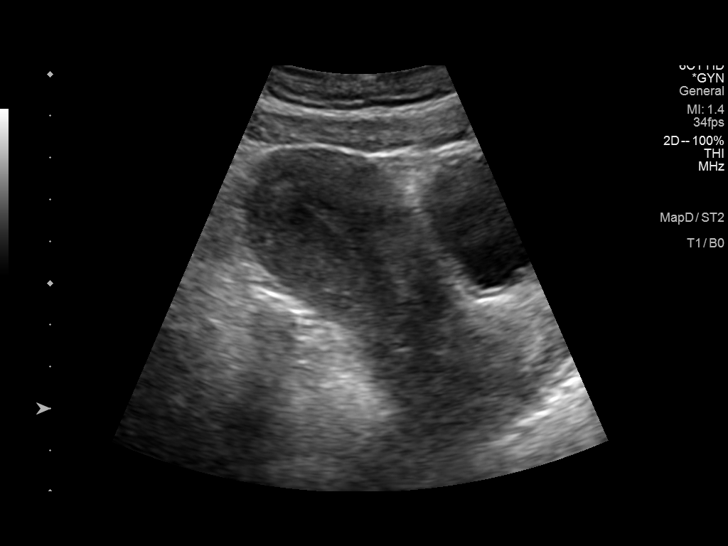
[im 9/100]
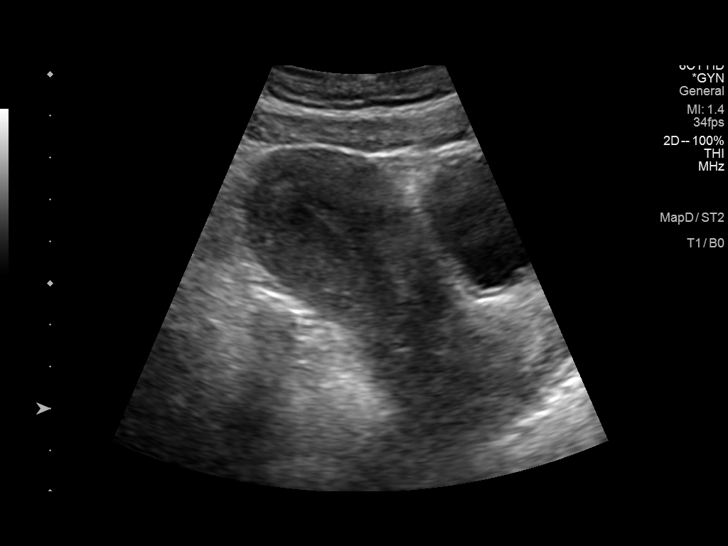
[im 17/100]
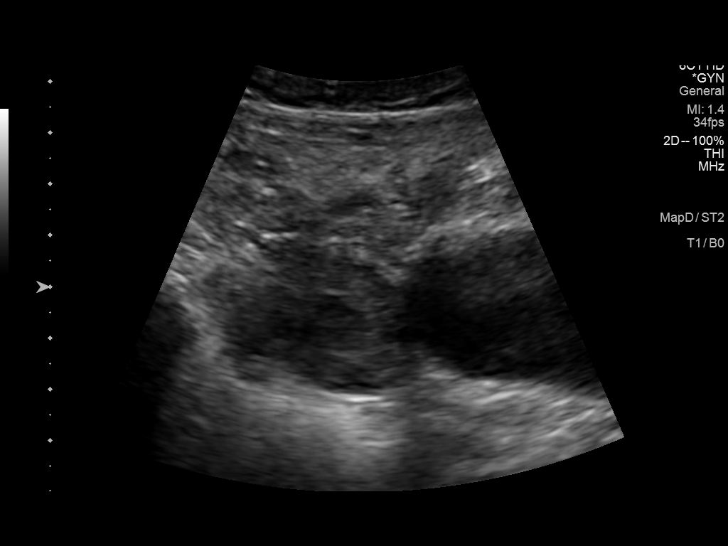
[im 25/100]
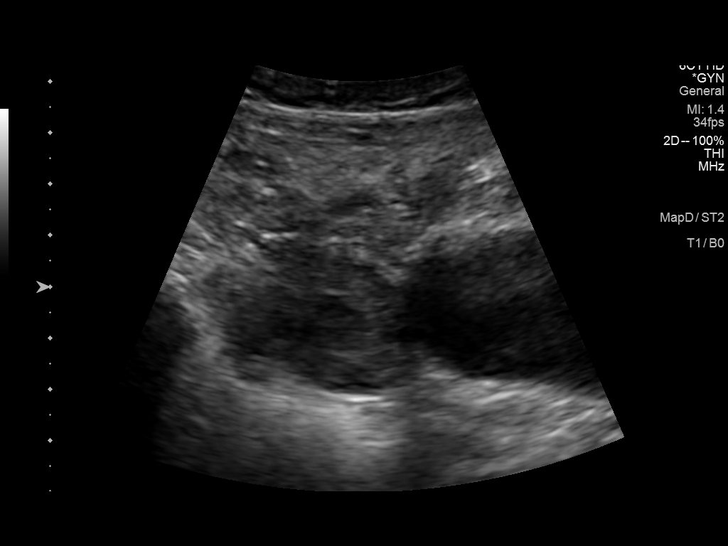
[im 34/100]
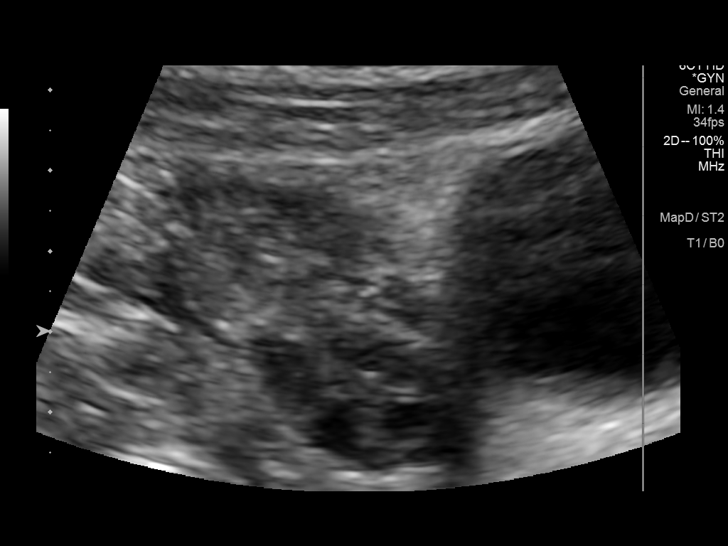
[im 38/100]
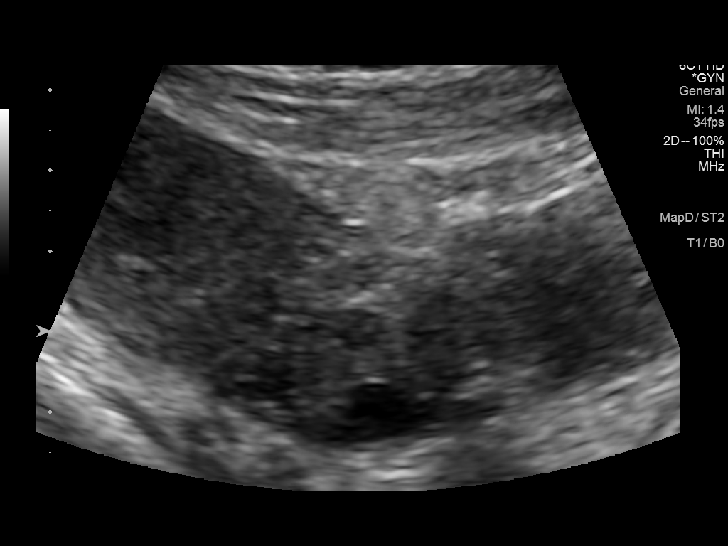
[im 46/100]
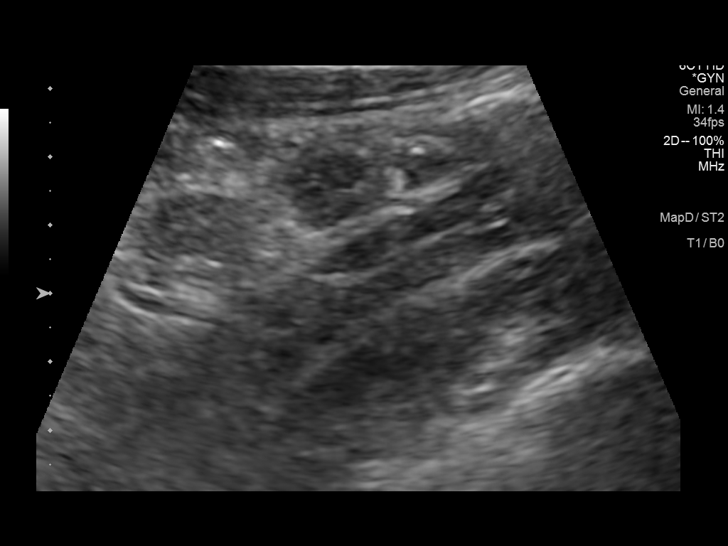
[im 54/100]
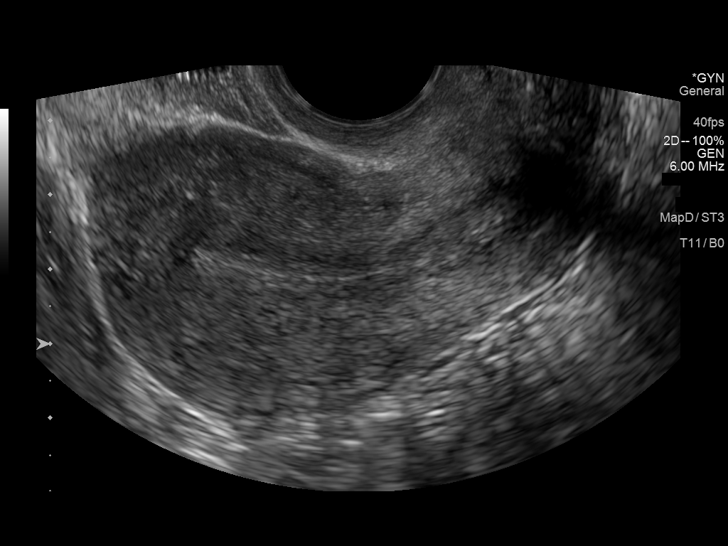
[im 62/100]
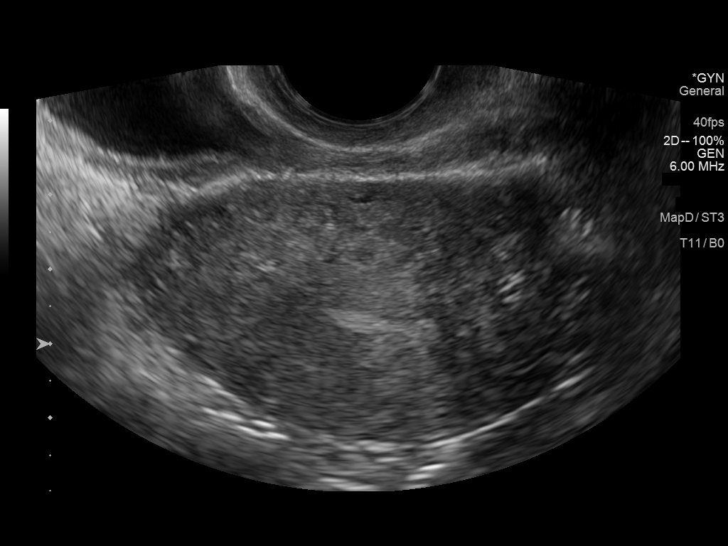
[im 67/100]
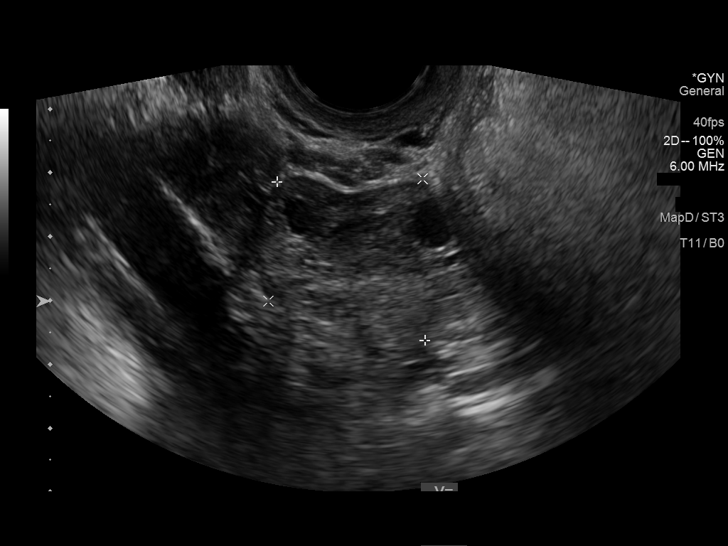
[im 75/100]
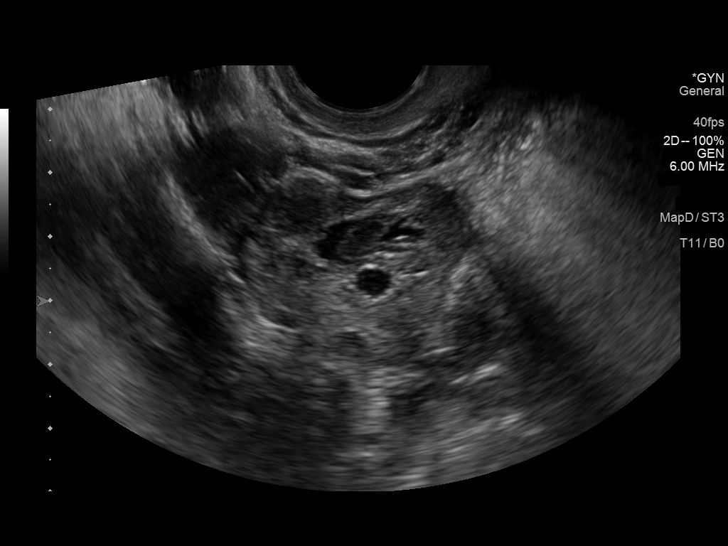
[im 83/100]
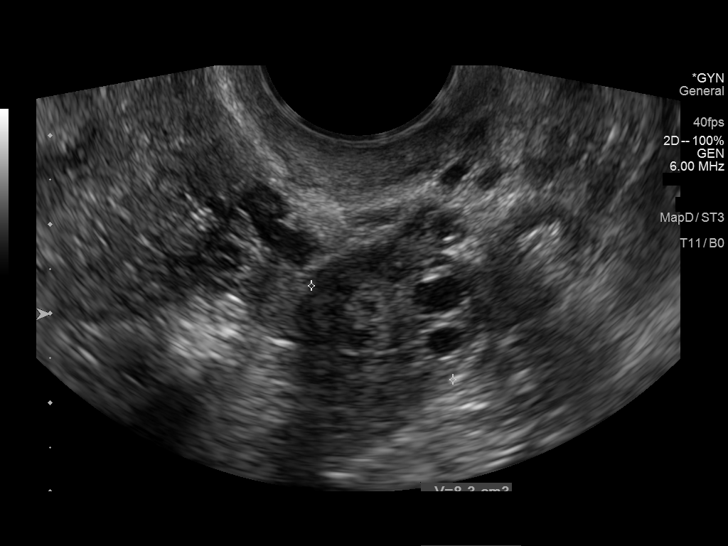
[im 91/100]
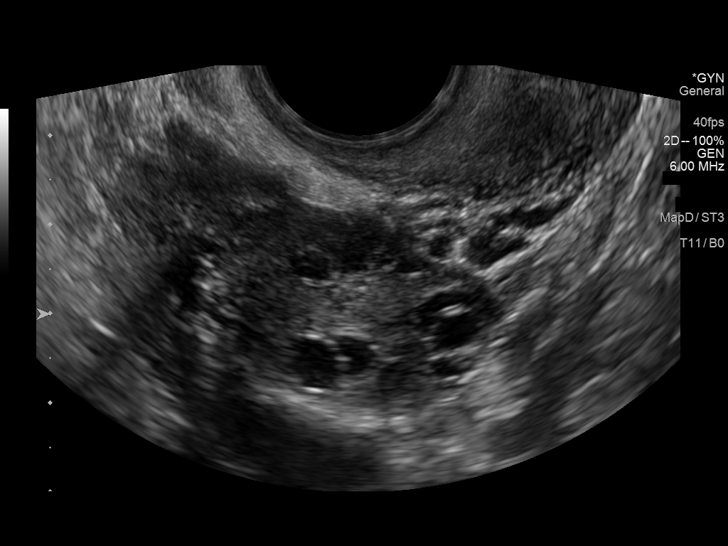
[im 100/100]
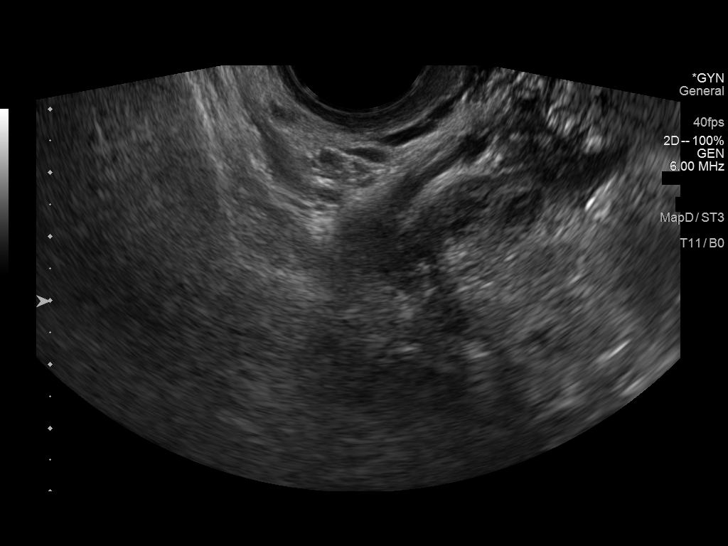

[14 of 25 positions shown; findings below may reference images not displayed]

FINDINGS: Uterus

Measurements: 7.1 x 3.7 x 6.2 cm = volume: 84 mL. Anteverted. Normal
morphology without mass

Endometrium

Thickness: 4 mm.  No endometrial fluid or mass

Right ovary

Measurements: 3.4 x 3.1 x 2.3 cm = volume: 12.8 mL. Normal
morphology without mass

Left ovary

Measurements: 3.7 x 2.2 x 1.9 cm = volume: 8.3 mL. Normal morphology
without mass

Other findings

No free pelvic fluid.  No adnexal masses.
IMPRESSION: Normal exam.

## 2023-08-28 LAB — OB RESULTS CONSOLE GBS: GBS: POSITIVE

## 2023-09-20 ENCOUNTER — Telehealth (HOSPITAL_COMMUNITY): Payer: Self-pay | Admitting: *Deleted

## 2023-09-20 NOTE — Telephone Encounter (Signed)
 Preadmission screen

## 2023-09-23 ENCOUNTER — Encounter (HOSPITAL_COMMUNITY): Payer: Self-pay | Admitting: *Deleted

## 2023-09-24 ENCOUNTER — Inpatient Hospital Stay (HOSPITAL_COMMUNITY)
Admission: RE | Admit: 2023-09-24 | Discharge: 2023-09-28 | DRG: 787 | Disposition: A | Discharge status: Home / Self Care | Attending: Obstetrics and Gynecology | Admitting: Obstetrics and Gynecology

## 2023-09-24 ENCOUNTER — Encounter (HOSPITAL_COMMUNITY): Payer: Self-pay | Admitting: Obstetrics and Gynecology

## 2023-09-24 DIAGNOSIS — Z87442 Personal history of urinary calculi: Secondary | ICD-10-CM | POA: Diagnosis not present

## 2023-09-24 DIAGNOSIS — O9081 Anemia of the puerperium: Secondary | ICD-10-CM | POA: Diagnosis not present

## 2023-09-24 DIAGNOSIS — Z3A40 40 weeks gestation of pregnancy: Secondary | ICD-10-CM

## 2023-09-24 DIAGNOSIS — O26893 Other specified pregnancy related conditions, third trimester: Secondary | ICD-10-CM | POA: Diagnosis present

## 2023-09-24 DIAGNOSIS — Z833 Family history of diabetes mellitus: Secondary | ICD-10-CM | POA: Diagnosis not present

## 2023-09-24 DIAGNOSIS — D62 Acute posthemorrhagic anemia: Secondary | ICD-10-CM | POA: Diagnosis not present

## 2023-09-24 DIAGNOSIS — O99824 Streptococcus B carrier state complicating childbirth: Principal | ICD-10-CM | POA: Diagnosis present

## 2023-09-24 LAB — CBC
HCT: 32 % — ABNORMAL LOW (ref 36.0–46.0)
Hemoglobin: 10.7 g/dL — ABNORMAL LOW (ref 12.0–15.0)
MCH: 28.2 pg (ref 26.0–34.0)
MCHC: 33.4 g/dL (ref 30.0–36.0)
MCV: 84.2 fL (ref 80.0–100.0)
Platelets: 119 10*3/uL — ABNORMAL LOW (ref 150–400)
RBC: 3.8 MIL/uL — ABNORMAL LOW (ref 3.87–5.11)
RDW: 13 % (ref 11.5–15.5)
WBC: 9.4 10*3/uL (ref 4.0–10.5)
nRBC: 0 % (ref 0.0–0.2)

## 2023-09-24 LAB — TYPE AND SCREEN
ABO/RH(D): O POS
Antibody Screen: NEGATIVE

## 2023-09-24 MED ORDER — FENTANYL-BUPIVACAINE-NACL 0.5-0.125-0.9 MG/250ML-% EP SOLN
12.0000 mL/h | EPIDURAL | Status: DC | PRN
  Administered 2023-09-25: 12 mL/h via EPIDURAL
  Filled 2023-09-24 (×2): qty 250

## 2023-09-24 MED ORDER — TERBUTALINE SULFATE 1 MG/ML IJ SOLN: 0.2500 mg | Freq: Once | INTRAMUSCULAR | Status: DC | PRN

## 2023-09-24 MED ORDER — OXYTOCIN-SODIUM CHLORIDE 30-0.9 UT/500ML-% IV SOLN: 2.5000 [IU]/h | INTRAVENOUS | Status: DC

## 2023-09-24 MED ORDER — ONDANSETRON HCL 4 MG/2ML IJ SOLN
4.0000 mg | Freq: Four times a day (QID) | INTRAMUSCULAR | Status: DC | PRN
  Administered 2023-09-25: 4 mg via INTRAVENOUS
  Filled 2023-09-24: qty 2

## 2023-09-24 MED ORDER — OXYCODONE-ACETAMINOPHEN 5-325 MG PO TABS: 2.0000 | ORAL_TABLET | ORAL | Status: DC | PRN

## 2023-09-24 MED ORDER — LACTATED RINGERS IV SOLN
500.0000 mL | INTRAVENOUS | Status: AC | PRN
  Administered 2023-09-25 (×2): 500 mL via INTRAVENOUS

## 2023-09-24 MED ORDER — OXYTOCIN BOLUS FROM INFUSION: 333.0000 mL | Freq: Once | INTRAVENOUS | Status: DC

## 2023-09-24 MED ORDER — LIDOCAINE HCL (PF) 1 % IJ SOLN: 30.0000 mL | INTRAMUSCULAR | Status: DC | PRN

## 2023-09-24 MED ORDER — SODIUM CHLORIDE 0.9 % IV SOLN
2.0000 g | Freq: Once | INTRAVENOUS | Status: AC
  Administered 2023-09-24: 2 g via INTRAVENOUS
  Filled 2023-09-24: qty 2000

## 2023-09-24 MED ORDER — ACETAMINOPHEN 325 MG PO TABS: 650.0000 mg | ORAL_TABLET | ORAL | Status: DC | PRN

## 2023-09-24 MED ORDER — LACTATED RINGERS IV SOLN: 500.0000 mL | Freq: Once | INTRAVENOUS | Status: DC

## 2023-09-24 MED ORDER — SODIUM CHLORIDE 0.9 % IV SOLN: 2.0000 g | Freq: Four times a day (QID) | INTRAVENOUS | Status: DC

## 2023-09-24 MED ORDER — OXYTOCIN-SODIUM CHLORIDE 30-0.9 UT/500ML-% IV SOLN
1.0000 m[IU]/min | INTRAVENOUS | Status: DC
  Administered 2023-09-24: 2 m[IU]/min via INTRAVENOUS
  Filled 2023-09-24: qty 500

## 2023-09-24 MED ORDER — FLEET ENEMA RE ENEM: 1.0000 | ENEMA | RECTAL | Status: DC | PRN

## 2023-09-24 MED ORDER — PHENYLEPHRINE 80 MCG/ML (10ML) SYRINGE FOR IV PUSH (FOR BLOOD PRESSURE SUPPORT): 80.0000 ug | PREFILLED_SYRINGE | INTRAVENOUS | Status: DC | PRN

## 2023-09-24 MED ORDER — LACTATED RINGERS IV SOLN: INTRAVENOUS | Status: AC

## 2023-09-24 MED ORDER — EPHEDRINE 5 MG/ML INJ: 10.0000 mg | INTRAVENOUS | Status: DC | PRN

## 2023-09-24 MED ORDER — MISOPROSTOL 25 MCG QUARTER TABLET
25.0000 ug | ORAL_TABLET | ORAL | Status: DC
  Administered 2023-09-24: 25 ug via ORAL
  Filled 2023-09-24: qty 1

## 2023-09-24 MED ORDER — OXYCODONE-ACETAMINOPHEN 5-325 MG PO TABS: 1.0000 | ORAL_TABLET | ORAL | Status: DC | PRN

## 2023-09-24 MED ORDER — SODIUM CHLORIDE 0.9 % IV SOLN
1.0000 g | INTRAVENOUS | Status: DC
  Administered 2023-09-25 (×5): 1 g via INTRAVENOUS
  Filled 2023-09-24 (×5): qty 1000

## 2023-09-24 MED ORDER — SOD CITRATE-CITRIC ACID 500-334 MG/5ML PO SOLN
30.0000 mL | ORAL | Status: DC | PRN
  Filled 2023-09-24: qty 30

## 2023-09-24 MED ORDER — MISOPROSTOL 25 MCG QUARTER TABLET
25.0000 ug | ORAL_TABLET | ORAL | Status: DC
  Administered 2023-09-24: 25 ug via VAGINAL
  Filled 2023-09-24: qty 1

## 2023-09-24 MED ORDER — DIPHENHYDRAMINE HCL 50 MG/ML IJ SOLN: 12.5000 mg | INTRAMUSCULAR | Status: DC | PRN

## 2023-09-24 MED ORDER — FENTANYL CITRATE (PF) 100 MCG/2ML IJ SOLN
100.0000 ug | INTRAMUSCULAR | Status: DC | PRN
  Administered 2023-09-25 (×2): 100 ug via INTRAVENOUS
  Filled 2023-09-24 (×2): qty 2

## 2023-09-24 NOTE — H&P (Signed)
 Tara Mccarthy is a 28 y.o.G 1 P 0 at 40 w 1 day presents for elective IOL.  GBBS +. History of kidney stone. History of asthma. OB History     Gravida  1   Para      Term      Preterm      AB      Living         SAB      IAB      Ectopic      Multiple      Live Births             Past Medical History:  Diagnosis Date   Asthma    Heart murmur    as child   Migraine    Nephrolithiasis    Sepsis (HCC) 11/11/2016   Past Surgical History:  Procedure Laterality Date   APPENDECTOMY     FOOT SURGERY Left    Turf toe- fixed the tendon   TONSILLECTOMY     Family History: family history includes Cancer in her paternal grandmother; Diabetes in her maternal grandfather; Healthy in her mother; Nephrolithiasis in her father. Social History:  reports that she has never smoked. She has never used smokeless tobacco. She reports that she does not drink alcohol and does not use drugs.     Maternal Diabetes: No Genetic Screening: Normal Maternal Ultrasounds/Referrals: Normal Fetal Ultrasounds or other Referrals:  None Maternal Substance Abuse:  No Significant Maternal Medications:  None Significant Maternal Lab Results:  Group B Strep positive Number of Prenatal Visits:greater than 3 verified prenatal visits Maternal Vaccinations: none Other Comments:  None  Review of Systems History   Last menstrual period 09/21/2022. Maternal Exam:  Abdomen: Fetal presentation: vertex   Fetal Exam Fetal State Assessment: Category I - tracings are normal.   Physical Exam Vitals and nursing note reviewed. Exam conducted with a chaperone present.  Constitutional:      Appearance: Normal appearance.  Cardiovascular:     Rate and Rhythm: Normal rate and regular rhythm.     Pulses: Normal pulses.  Neurological:     Mental Status: She is alert.     Prenatal labs: ABO, Rh:   Antibody: Negative (07/31 0000) Rubella: Immune (07/31 0000) RPR: Nonreactive (07/31 0000)   HBsAg: Negative (07/31 0000)  HIV: Non-Reactive (07/31 0000)  GBS: Positive/-- (02/12 0000)   Assessment/Plan: IUP at term Elective IOL  Admit for IOL Cytotec - then transition to Pitocin Epidural PRN GBBS + Treat in labor   Jeani Hawking 09/24/2023, 3:32 PM

## 2023-09-25 ENCOUNTER — Inpatient Hospital Stay (HOSPITAL_COMMUNITY): Admitting: Anesthesiology

## 2023-09-25 ENCOUNTER — Other Ambulatory Visit: Payer: Self-pay

## 2023-09-25 ENCOUNTER — Encounter (HOSPITAL_COMMUNITY)
Admission: RE | Disposition: A | Discharge status: Home / Self Care | Payer: Self-pay | Source: Home / Self Care | Attending: Obstetrics and Gynecology

## 2023-09-25 DIAGNOSIS — Z3A40 40 weeks gestation of pregnancy: Secondary | ICD-10-CM

## 2023-09-25 LAB — RPR: RPR Ser Ql: NONREACTIVE

## 2023-09-25 SURGERY — Surgical Case
Anesthesia: Epidural | Site: Abdomen

## 2023-09-25 MED ORDER — GENTAMICIN SULFATE 40 MG/ML IJ SOLN
5.0000 mg/kg | Freq: Once | INTRAVENOUS | Status: AC
  Administered 2023-09-26: 290 mg via INTRAVENOUS
  Filled 2023-09-25: qty 7.25

## 2023-09-25 MED ORDER — LIDOCAINE HCL (PF) 1 % IJ SOLN
INTRAMUSCULAR | Status: DC | PRN
Start: 2023-09-25 — End: 2023-09-26
  Administered 2023-09-25: 5 mL via EPIDURAL

## 2023-09-25 MED ORDER — LIDOCAINE-EPINEPHRINE (PF) 1.5 %-1:200000 IJ SOLN
INTRAMUSCULAR | Status: DC | PRN
  Administered 2023-09-25: 5 mL via EPIDURAL

## 2023-09-25 MED ORDER — FENTANYL CITRATE (PF) 100 MCG/2ML IJ SOLN
INTRAMUSCULAR | Status: AC
  Filled 2023-09-25: qty 2

## 2023-09-25 MED ORDER — CLINDAMYCIN PHOSPHATE 900 MG/50ML IV SOLN
900.0000 mg | Freq: Once | INTRAVENOUS | Status: AC
  Administered 2023-09-25: 900 mg via INTRAVENOUS

## 2023-09-25 MED ORDER — MORPHINE SULFATE (PF) 0.5 MG/ML IJ SOLN
INTRAMUSCULAR | Status: AC
  Filled 2023-09-25: qty 10

## 2023-09-25 MED ORDER — SODIUM CHLORIDE 0.9 % IV SOLN
2.0000 g | Freq: Once | INTRAVENOUS | Status: AC
  Administered 2023-09-26: 2 g via INTRAVENOUS
  Filled 2023-09-25: qty 2000

## 2023-09-25 SURGICAL SUPPLY — 17 items
BENZOIN TINCTURE PRP APPL 2/3 (GAUZE/BANDAGES/DRESSINGS) IMPLANT
CHLORAPREP W/TINT 26 (MISCELLANEOUS) ×2 IMPLANT
DRSG OPSITE POSTOP 4X10 (GAUZE/BANDAGES/DRESSINGS) ×1 IMPLANT
ELECT REM PT RETURN 9FT ADLT (ELECTROSURGICAL) ×1 IMPLANT
ELECTRODE REM PT RTRN 9FT ADLT (ELECTROSURGICAL) ×1 IMPLANT
GLOVE BIOGEL PI IND STRL 7.0 (GLOVE) ×1 IMPLANT
GLOVE SURG ORTHO 8.0 STRL STRW (GLOVE) ×1 IMPLANT
GOWN STRL REUS W/TWL LRG LVL3 (GOWN DISPOSABLE) ×2 IMPLANT
NS IRRIG 1000ML POUR BTL (IV SOLUTION) ×1 IMPLANT
PACK C SECTION WH (CUSTOM PROCEDURE TRAY) ×1 IMPLANT
PAD OB MATERNITY 4.3X12.25 (PERSONAL CARE ITEMS) ×1 IMPLANT
STRIP CLOSURE SKIN 1/2X4 (GAUZE/BANDAGES/DRESSINGS) IMPLANT
SUT MNCRL 0 VIOLET CTX 36 (SUTURE) ×3 IMPLANT
SUT MON AB 4-0 PS1 27 (SUTURE) ×1 IMPLANT
SUT VIC AB 1 CTX36XBRD ANBCTRL (SUTURE) IMPLANT
TOWEL OR 17X24 6PK STRL BLUE (TOWEL DISPOSABLE) ×1 IMPLANT
WATER STERILE IRR 1000ML POUR (IV SOLUTION) ×1 IMPLANT

## 2023-09-25 NOTE — Progress Notes (Addendum)
 Patient ID: Tara Mccarthy, female   DOB: 04-17-1996, 28 y.o.   MRN: [id number] Tara Mccarthy is frustrated and exhausted and with no cervical change wants a c/s VSSAF FHR 120s with accels Ctxs q 2-4 minutes  [id number] with no change in cervix in the last 5 hours Cx 2/90/-2  Arrest of dilation - recommend Primary cesarean (patient also requests c/s at this time) Risks and benefits of C/S were discussed.  All questions were answered and informed consent was obtained.  Plan to proceed with low segment transverse Cesarean Section. I spoke with pharmacy regarding abx prophylaxis.  She is currently on amp for GBS and has allergy to rocephin and has done well.  Pharmacy recommended adding gent/clinda to the amp since she tolerates it.  And zithromycin due to ROM and labor. DL

## 2023-09-25 NOTE — Anesthesia Preprocedure Evaluation (Signed)
 Anesthesia Evaluation  Patient identified by MRN, date of birth, ID band Patient awake    Reviewed: Allergy & Precautions, H&P , NPO status , Patient's Chart, lab work & pertinent test results  History of Anesthesia Complications Negative for: history of anesthetic complications  Airway Mallampati: II       Dental no notable dental hx.    Pulmonary asthma    Pulmonary exam normal        Cardiovascular negative cardio ROS Normal cardiovascular exam     Neuro/Psych  Headaches  negative psych ROS   GI/Hepatic negative GI ROS, Neg liver ROS,,,  Endo/Other  negative endocrine ROS    Renal/GU negative Renal ROS  negative genitourinary   Musculoskeletal   Abdominal   Peds  Hematology  (+) Blood dyscrasia, anemia PLT: 119   Anesthesia Other Findings   Reproductive/Obstetrics (+) Pregnancy                             Anesthesia Physical Anesthesia Plan  ASA: 2  Anesthesia Plan: Epidural   Post-op Pain Management:    Induction:   PONV Risk Score and Plan:   Airway Management Planned:   Additional Equipment:   Intra-op Plan:   Post-operative Plan:   Informed Consent: I have reviewed the patients History and Physical, chart, labs and discussed the procedure including the risks, benefits and alternatives for the proposed anesthesia with the patient or authorized representative who has indicated his/her understanding and acceptance.       Plan Discussed with:   Anesthesia Plan Comments:        Anesthesia Quick Evaluation

## 2023-09-25 NOTE — Anesthesia Procedure Notes (Signed)
 Epidural Patient location during procedure: OB Start time: 09/25/2023 4:53 AM End time: 09/25/2023 5:03 AM  Staffing Anesthesiologist: Leonides Grills, MD Performed: anesthesiologist   Preanesthetic Checklist Completed: patient identified, IV checked, site marked, risks and benefits discussed, monitors and equipment checked, pre-op evaluation and timeout performed  Epidural Patient position: sitting Prep: DuraPrep Patient monitoring: heart rate, cardiac monitor, continuous pulse ox and blood pressure Approach: midline Location: L4-L5 Injection technique: LOR air  Needle:  Needle type: Tuohy  Needle gauge: 17 G Needle length: 9 cm Needle insertion depth: 5 cm Catheter type: closed end flexible Catheter size: 19 Gauge Catheter at skin depth: 10 cm Test dose: negative and 1.5% lidocaine with Epi 1:200 K  Assessment Events: blood not aspirated, no cerebrospinal fluid, injection not painful, no injection resistance and negative IV test  Additional Notes Informed consent obtained prior to proceeding including risk of failure, 1% risk of PDPH, risk of minor discomfort and bruising. Discussed alternatives to epidural analgesia and patient desires to proceed.  Timeout performed pre-procedure verifying patient name, procedure, and platelet count.  Patient tolerated procedure well. Reason for block:procedure for pain

## 2023-09-25 NOTE — Progress Notes (Signed)
 Tara Mccarthy is a 28 y.o. G1P0 at [redacted]w[redacted]d by LMP admitted for induction of labor due to Elective at term.  Subjective:   Objective: BP 106/60   Pulse (!) 54   Temp 97.7 F (36.5 C) (Oral)   Resp 18   Ht 5\' 2"  (1.575 m)   Wt 68.9 kg   LMP 09/21/2022   SpO2 98%   BMI 27.80 kg/m  No intake/output data recorded. No intake/output data recorded.  FHT:  FHR: 120 bpm, variability: moderate,  accelerations:  Present,  decelerations:  Absent UC:   irregular, every 2=7 minutes SVE:   Dilation: Fingertip Effacement (%): 30 Station: -3 Exam by:: [employer] RN  Labs: Lab Results  Component Value Date   WBC 9.4 09/24/2023   HGB 10.7 (L) 09/24/2023   HCT 32.0 (L) 09/24/2023   MCV 84.2 09/24/2023   PLT 119 (L) 09/24/2023    Assessment / Plan: Induction of labor due to postterm,  progressing well on pitocin  Labor: Progressing on Pitocin, will continue to increase then AROM Preeclampsia:  no signs or symptoms of toxicity Fetal Wellbeing:  Category I Pain Control:  Epidural I/D:  n/a on Amp for GBS positive Anticipated MOD:  NSVD  Turner Daniels, MD 09/25/2023, 9:33 AM

## 2023-09-26 ENCOUNTER — Other Ambulatory Visit: Payer: Self-pay

## 2023-09-26 ENCOUNTER — Encounter (HOSPITAL_COMMUNITY): Payer: Self-pay | Admitting: Obstetrics and Gynecology

## 2023-09-26 LAB — BIRTH TISSUE RECOVERY COLLECTION (PLACENTA DONATION)

## 2023-09-26 LAB — CBC
HCT: 27.1 % — ABNORMAL LOW (ref 36.0–46.0)
Hemoglobin: 9 g/dL — ABNORMAL LOW (ref 12.0–15.0)
MCH: 27.8 pg (ref 26.0–34.0)
MCHC: 33.2 g/dL (ref 30.0–36.0)
MCV: 83.6 fL (ref 80.0–100.0)
Platelets: 113 10*3/uL — ABNORMAL LOW (ref 150–400)
RBC: 3.24 MIL/uL — ABNORMAL LOW (ref 3.87–5.11)
RDW: 12.9 % (ref 11.5–15.5)
WBC: 14.9 10*3/uL — ABNORMAL HIGH (ref 4.0–10.5)
nRBC: 0 % (ref 0.0–0.2)

## 2023-09-26 MED ORDER — OXYCODONE HCL 5 MG PO TABS: 5.0000 mg | ORAL_TABLET | ORAL | Status: DC | PRN

## 2023-09-26 MED ORDER — SIMETHICONE 80 MG PO CHEW
80.0000 mg | CHEWABLE_TABLET | ORAL | Status: DC | PRN
  Administered 2023-09-28: 80 mg via ORAL
  Filled 2023-09-26: qty 1

## 2023-09-26 MED ORDER — ZOLPIDEM TARTRATE 5 MG PO TABS: 5.0000 mg | ORAL_TABLET | Freq: Every evening | ORAL | Status: DC | PRN

## 2023-09-26 MED ORDER — DIPHENHYDRAMINE HCL 25 MG PO CAPS: 25.0000 mg | ORAL_CAPSULE | ORAL | Status: DC | PRN

## 2023-09-26 MED ORDER — TRANEXAMIC ACID-NACL 1000-0.7 MG/100ML-% IV SOLN
INTRAVENOUS | Status: DC | PRN
  Administered 2023-09-26: 1000 mg via INTRAVENOUS

## 2023-09-26 MED ORDER — DOCUSATE SODIUM 100 MG PO CAPS
100.0000 mg | ORAL_CAPSULE | Freq: Every day | ORAL | Status: DC
  Administered 2023-09-26 – 2023-09-28 (×3): 100 mg via ORAL
  Filled 2023-09-26 (×3): qty 1

## 2023-09-26 MED ORDER — LACTATED RINGERS IV SOLN: INTRAVENOUS | Status: DC | PRN

## 2023-09-26 MED ORDER — WITCH HAZEL-GLYCERIN EX PADS: 1.0000 | MEDICATED_PAD | CUTANEOUS | Status: DC | PRN

## 2023-09-26 MED ORDER — FERROUS SULFATE 325 (65 FE) MG PO TABS
325.0000 mg | ORAL_TABLET | ORAL | Status: DC
  Administered 2023-09-26 – 2023-09-28 (×2): 325 mg via ORAL
  Filled 2023-09-26 (×2): qty 1

## 2023-09-26 MED ORDER — MORPHINE SULFATE (PF) 0.5 MG/ML IJ SOLN
INTRAMUSCULAR | Status: DC | PRN
  Administered 2023-09-26: 2 mg via INTRAVENOUS

## 2023-09-26 MED ORDER — DIPHENHYDRAMINE HCL 25 MG PO CAPS: 25.0000 mg | ORAL_CAPSULE | Freq: Four times a day (QID) | ORAL | Status: DC | PRN

## 2023-09-26 MED ORDER — SODIUM CHLORIDE 0.9 % IV SOLN
INTRAVENOUS | Status: DC | PRN
  Administered 2023-09-25: 500 mg via INTRAVENOUS

## 2023-09-26 MED ORDER — STERILE WATER FOR IRRIGATION IR SOLN
Status: DC | PRN
  Administered 2023-09-26: 1000 mL

## 2023-09-26 MED ORDER — MENTHOL 3 MG MT LOZG: 1.0000 | LOZENGE | OROMUCOSAL | Status: DC | PRN

## 2023-09-26 MED ORDER — TETANUS-DIPHTH-ACELL PERTUSSIS 5-2.5-18.5 LF-MCG/0.5 IM SUSY: 0.5000 mL | PREFILLED_SYRINGE | Freq: Once | INTRAMUSCULAR | Status: DC

## 2023-09-26 MED ORDER — SODIUM CHLORIDE 0.9 % IR SOLN
Status: DC | PRN
  Administered 2023-09-26: 1

## 2023-09-26 MED ORDER — ALBUTEROL SULFATE (2.5 MG/3ML) 0.083% IN NEBU
2.5000 mg | INHALATION_SOLUTION | RESPIRATORY_TRACT | Status: DC | PRN
Start: 2023-09-26 — End: 2023-09-28

## 2023-09-26 MED ORDER — MEASLES, MUMPS & RUBELLA VAC IJ SOLR
0.5000 mL | Freq: Once | INTRAMUSCULAR | Status: DC
Start: 2023-09-27 — End: 2023-09-28

## 2023-09-26 MED ORDER — NALOXONE HCL 4 MG/10ML IJ SOLN: 1.0000 ug/kg/h | INTRAVENOUS | Status: DC | PRN

## 2023-09-26 MED ORDER — KETOROLAC TROMETHAMINE 30 MG/ML IJ SOLN
30.0000 mg | Freq: Four times a day (QID) | INTRAMUSCULAR | Status: DC | PRN
  Administered 2023-09-26: 30 mg via INTRAVENOUS

## 2023-09-26 MED ORDER — ONDANSETRON HCL 4 MG/2ML IJ SOLN: 4.0000 mg | Freq: Once | INTRAMUSCULAR | Status: DC | PRN

## 2023-09-26 MED ORDER — MORPHINE SULFATE (PF) 0.5 MG/ML IJ SOLN
INTRAMUSCULAR | Status: DC | PRN
  Administered 2023-09-26: 3 mg via EPIDURAL

## 2023-09-26 MED ORDER — MEPERIDINE HCL 25 MG/ML IJ SOLN: 6.2500 mg | INTRAMUSCULAR | Status: DC | PRN

## 2023-09-26 MED ORDER — ACETAMINOPHEN 500 MG PO TABS
1000.0000 mg | ORAL_TABLET | Freq: Four times a day (QID) | ORAL | Status: AC
  Administered 2023-09-26 (×3): 1000 mg via ORAL
  Filled 2023-09-26 (×3): qty 2

## 2023-09-26 MED ORDER — IBUPROFEN 600 MG PO TABS
600.0000 mg | ORAL_TABLET | Freq: Four times a day (QID) | ORAL | Status: DC | PRN
  Administered 2023-09-27 (×2): 600 mg via ORAL
  Filled 2023-09-26 (×2): qty 1

## 2023-09-26 MED ORDER — SIMETHICONE 80 MG PO CHEW
80.0000 mg | CHEWABLE_TABLET | Freq: Three times a day (TID) | ORAL | Status: DC
  Administered 2023-09-26 – 2023-09-28 (×7): 80 mg via ORAL
  Filled 2023-09-26 (×6): qty 1

## 2023-09-26 MED ORDER — SCOPOLAMINE 1 MG/3DAYS TD PT72
MEDICATED_PATCH | TRANSDERMAL | Status: AC
  Filled 2023-09-26: qty 1

## 2023-09-26 MED ORDER — KETOROLAC TROMETHAMINE 30 MG/ML IJ SOLN
INTRAMUSCULAR | Status: AC
  Filled 2023-09-26: qty 1

## 2023-09-26 MED ORDER — OXYTOCIN-SODIUM CHLORIDE 30-0.9 UT/500ML-% IV SOLN: 2.5000 [IU]/h | INTRAVENOUS | Status: AC

## 2023-09-26 MED ORDER — KETOROLAC TROMETHAMINE 30 MG/ML IJ SOLN: 30.0000 mg | Freq: Four times a day (QID) | INTRAMUSCULAR | Status: DC | PRN

## 2023-09-26 MED ORDER — NALOXONE HCL 0.4 MG/ML IJ SOLN: 0.4000 mg | INTRAMUSCULAR | Status: DC | PRN

## 2023-09-26 MED ORDER — ONDANSETRON HCL 4 MG/2ML IJ SOLN
INTRAMUSCULAR | Status: DC | PRN
  Administered 2023-09-26: 4 mg via INTRAVENOUS

## 2023-09-26 MED ORDER — KETOROLAC TROMETHAMINE 30 MG/ML IJ SOLN
30.0000 mg | Freq: Four times a day (QID) | INTRAMUSCULAR | Status: AC
  Administered 2023-09-26 – 2023-09-27 (×4): 30 mg via INTRAVENOUS
  Filled 2023-09-26 (×4): qty 1

## 2023-09-26 MED ORDER — SODIUM CHLORIDE 0.9% FLUSH: 3.0000 mL | INTRAVENOUS | Status: DC | PRN

## 2023-09-26 MED ORDER — COCONUT OIL OIL
1.0000 | TOPICAL_OIL | Status: DC | PRN
  Administered 2023-09-26: 1 via TOPICAL

## 2023-09-26 MED ORDER — AMISULPRIDE (ANTIEMETIC) 5 MG/2ML IV SOLN: 10.0000 mg | Freq: Once | INTRAVENOUS | Status: DC | PRN

## 2023-09-26 MED ORDER — LIDOCAINE-EPINEPHRINE (PF) 2 %-1:200000 IJ SOLN
INTRAMUSCULAR | Status: DC | PRN
  Administered 2023-09-25 – 2023-09-26 (×3): 5 mL via EPIDURAL

## 2023-09-26 MED ORDER — FENTANYL CITRATE (PF) 100 MCG/2ML IJ SOLN
INTRAMUSCULAR | Status: DC | PRN
  Administered 2023-09-26: 100 ug via EPIDURAL

## 2023-09-26 MED ORDER — OXYTOCIN-SODIUM CHLORIDE 30-0.9 UT/500ML-% IV SOLN
INTRAVENOUS | Status: DC | PRN
  Administered 2023-09-26: 999 mL/h via INTRAVENOUS

## 2023-09-26 MED ORDER — METOCLOPRAMIDE HCL 5 MG/ML IJ SOLN
INTRAMUSCULAR | Status: DC | PRN
  Administered 2023-09-25: 10 mg via INTRAVENOUS

## 2023-09-26 MED ORDER — ACETAMINOPHEN 325 MG PO TABS
650.0000 mg | ORAL_TABLET | ORAL | Status: DC | PRN
  Administered 2023-09-27 – 2023-09-28 (×3): 650 mg via ORAL
  Filled 2023-09-26 (×3): qty 2

## 2023-09-26 MED ORDER — FENTANYL CITRATE (PF) 100 MCG/2ML IJ SOLN
25.0000 ug | INTRAMUSCULAR | Status: DC | PRN
Start: 2023-09-26 — End: 2023-09-26

## 2023-09-26 MED ORDER — DIBUCAINE (PERIANAL) 1 % EX OINT: 1.0000 | TOPICAL_OINTMENT | CUTANEOUS | Status: DC | PRN

## 2023-09-26 MED ORDER — ONDANSETRON HCL 4 MG/2ML IJ SOLN: 4.0000 mg | Freq: Three times a day (TID) | INTRAMUSCULAR | Status: DC | PRN

## 2023-09-26 MED ORDER — DIPHENHYDRAMINE HCL 50 MG/ML IJ SOLN: 12.5000 mg | INTRAMUSCULAR | Status: DC | PRN

## 2023-09-26 MED ORDER — SCOPOLAMINE 1 MG/3DAYS TD PT72
1.0000 | MEDICATED_PATCH | Freq: Once | TRANSDERMAL | Status: DC
Start: 2023-09-26 — End: 2023-09-28
  Administered 2023-09-26: 1.5 mg via TRANSDERMAL

## 2023-09-26 MED ORDER — PRENATAL MULTIVITAMIN CH
1.0000 | ORAL_TABLET | Freq: Every day | ORAL | Status: DC
  Administered 2023-09-26 – 2023-09-27 (×2): 1 via ORAL
  Filled 2023-09-26 (×2): qty 1

## 2023-09-26 MED ORDER — SENNOSIDES-DOCUSATE SODIUM 8.6-50 MG PO TABS
2.0000 | ORAL_TABLET | ORAL | Status: DC
  Administered 2023-09-26 – 2023-09-28 (×3): 2 via ORAL
  Filled 2023-09-26 (×3): qty 2

## 2023-09-26 MED ORDER — DEXAMETHASONE SODIUM PHOSPHATE 10 MG/ML IJ SOLN
INTRAMUSCULAR | Status: DC | PRN
  Administered 2023-09-26: 5 mg via INTRAVENOUS

## 2023-09-26 NOTE — Lactation Note (Signed)
 This note was copied from a baby's chart. Lactation Consultation Note  Patient Name: Tara Mccarthy WGNFA'O Date: 09/26/2023 Age:26 hours  Reason for consult: Initial assessment;Exclusive pumping and bottle feeding;Primapara;1st time breastfeeding;Term  Followed up with mother's nurse regarding mother's readiness for LC to return to room and start mother pumping, since she has not called out. Marilynn Rail, RN says she has also offered to set up the breast pump for Tara Mccarthy. Mother would like to use her personal pump brought from home and wait until later to start pumping.   Feeding Mother's Current Feeding Choice: Breast Milk and Formula Nipple Type: Slow - flow    Interventions Interventions: Education;CDC milk storage guidelines;CDC Guidelines for Breast Pump Cleaning  Discharge Pump: DEBP;Personal  Consult Status Date: 09/27/23 Follow-up type: In-patient    Christella Hartigan M 09/26/2023, 1:07 PM

## 2023-09-26 NOTE — Op Note (Signed)
 Cesarean Section Procedure Note  Pre-operative Diagnosis: IUP at 40 3/7, Arrest of dilation  Post-operative Diagnosis: same plus OP and asynclitic presentation  Surgeon: Olanda Downie C   Assistants: scrub tech  Anesthesia: epidural  Procedure:  Low Segment Transverse cesarean section  Procedure Details  The patient was seen in the Holding Room. The risks, benefits, complications, treatment options, and expected outcomes were discussed with the patient.  The patient concurred with the proposed plan, giving informed consent.  The site of surgery properly noted/marked.. A Time Out was held and the above information confirmed.  After induction of anesthesia, the patient was draped and prepped in the usual sterile manner. A Pfannenstiel incision was made and carried down through the subcutaneous tissue to the fascia. Fascial incision was made and extended transversely. The fascia was separated from the underlying rectus tissue superiorly and inferiorly. The peritoneum was identified and entered. Peritoneal incision was extended longitudinally. The utero-vesical peritoneal reflection was incised transversely and the bladder flap was bluntly freed from the lower uterine segment. A low transverse uterine incision was made. Delivered from op asynclitic presentation was a baby with Apgar scores of 8 at one minute and 9 at five minutes. After the umbilical cord was clamped and cut cord blood was obtained for evaluation. The placenta was removed intact and appeared normal. The uterine outline, tubes and ovaries appeared normal. The uterine incision was closed with running locked sutures of 0 monocryl and imbricated with 0 monocryl. Hemostasis was observed. Lavage was carried out until clear. The peritoneum was then closed with 0 monocryl and rectus muscles plicated in the midline.  After hemostasis was assured, the fascia was then reapproximated with running sutures of 0 Vicryl. Irrigation was applied and after  adequate hemostasis was assured, the skin was reapproximated with subcutaneous sutures using 4-0 monocryl.  Instrument, sponge, and needle counts were correct prior the abdominal closure and at the conclusion of the case. The patient received 2 grams cefotetan preoperatively.  Findings: Viable female  Estimated Blood Loss:  552cc         Specimens: Placenta was sent to labor and delivery         Complications:  None

## 2023-09-26 NOTE — Lactation Note (Signed)
 This note was copied from a baby's chart. Lactation Consultation Note  Patient Name: Tara Mccarthy [id number] Date: 09/26/2023 Age:26 hours Reason for consult: Mother's request;Primapara;Term MGM asked LC if I would review pumping kit again to mom that she was going to be pumping soon. Also needed nipples to put on the bottles. LC reviewed for mom, gave extra storage bottles and nipples. Reviewed milk storage as well.  Maternal Data Has patient been taught Hand Expression?: No Does the patient have breastfeeding experience prior to this delivery?: No  Feeding Mother's Current Feeding Choice: Breast Milk and Formula  LATCH Score                    Lactation Tools Discussed/Used Tools: Pump;Flanges Flange Size: 18 Breast pump type: Double-Electric Breast Pump Pump Education: Milk Storage;Setup, frequency, and cleaning Reason for Pumping: pump and bottle feeding Pumping frequency: q 3hr Pumped volume: 15 mL  Interventions Interventions: Education;LC Services brochure;CDC milk storage guidelines;CDC Guidelines for Breast Pump Cleaning  Discharge Pump: DEBP;Hands Free Tara Mccarthy)  Consult Status Consult Status: Follow-up Date: 10/28/23 Follow-up type: In-patient    Charyl Dancer 09/26/2023, 8:30 PM

## 2023-09-26 NOTE — Lactation Note (Signed)
 This note was copied from a baby's chart. Lactation Consultation Note  Patient Name: Tara Mccarthy [id number] Date: 09/26/2023 Age:26 hours  P1, [redacted]w[redacted]d  Mother plans to exclusively pump and bottle feed. Baby is currently getting formula. Mother was having breakfast and visitors present. Mother will call out and request LC when she is ready to start pumping. Name left on white board.   Christella Hartigan M 09/26/2023, 9:05 AM

## 2023-09-26 NOTE — Progress Notes (Signed)
 Postpartum Progress Note  Postpartum Day 0 s/p primary Cesarean section. Delivery at 00:06.  Subjective:  Patient reports no overnight events.  She reports well controlled pain, foley remains in place without difficulty, voiding spontaneously, tolerating PO.  She reports Negative flatus, Negative BM.  Vaginal bleeding is minimal.  Objective: Blood pressure 123/70, pulse 92, temperature 99.1 F (37.3 C), temperature source Oral, resp. rate 18, height 5\' 2"  (1.575 m), weight 68.9 kg, last menstrual period 09/21/2022, SpO2 98%, unknown if currently breastfeeding.  Physical Exam:  General: alert and no distress Lochia: appropriate Abdomen: soft, ATTP Uterine Fundus: firm Incision: clean/dry/intact DVT Evaluation: No evidence of DVT seen on physical exam.  Recent Labs    09/24/23 1535 09/26/23 0424  HGB 10.7* 9.0*  HCT 32.0* 27.1*    Assessment/Plan: Postpartum Day 0, s/p C-section, delivery baby girl at 00:06 Acute blood loss anemia, clinically significant - Fe/Colace. No signs or symptoms of anemia.  Lactation following Abdominal binder Doing well, continue routine postpartum care. Anticipate discharge PPD2 or 3   LOS: 2 days   Lyn Henri 09/26/2023, 7:53 AM

## 2023-09-26 NOTE — Lactation Note (Signed)
 This note was copied from a baby's chart. Lactation Consultation Note  Patient Name: Tara Mccarthy [id number] Date: 09/26/2023 Age:26 hours  Reason for consult: Initial assessment;Exclusive pumping and bottle feeding  Mother called LC to room. Mother has decided to use the hospital DEBP. Instructed mother on the use, frequency, cleaning of breast pump and storage of breast milk. She was fitted with an 18 mm flange. Mother has been leaking but did not collect prior to delivery. She expressed 15 ml of colostrum.      Maternal Data Has patient been taught Hand Expression?: No Does the patient have breastfeeding experience prior to this delivery?: No  Feeding Mother's Current Feeding Choice: Breast Milk and Formula   Lactation Tools Discussed/Used Tools: Pump;Flanges;Coconut oil Flange Size: 18 Breast pump type: Double-Electric Breast Pump;Manual Pump Education: Setup, frequency, and cleaning;Milk Storage Reason for Pumping: pumping and bottle feeding Pumping frequency: every 3 hours for 15 min Pumped volume: 15 mL  Interventions Interventions: Education;LC Services brochure;CDC milk storage guidelines;CDC Guidelines for Breast Pump Cleaning  Discharge Pump: DEBP;Hands Free Margarette [place])  Consult Status Consult Status: Follow-up Date: 09/27/23 Follow-up type: In-patient    Christella Hartigan M 09/26/2023, 7:01 PM

## 2023-09-26 NOTE — Lactation Note (Signed)
 This note was copied from a baby's chart. Lactation Consultation Note  Patient Name: Tara Mccarthy [id number]'U Date: 09/26/2023 Age:26 hours  Attempted to see mom. Mom asked if Lactation could come back tomorrow (today). Mom stated she is exhausted and needs to rest. Mom stated she doesn't plan on putting the baby to her breast. She is going to pump and bottle feed. LC stated I can set up our DEBP. Mom stated no she preferred to use her pump because that is what she is going to be using. Doesn't want to pump tonight. Will wait until morning. Left LC brochure.  Call if she changes her mind and needs LC tonight.    Maternal Data    Feeding    LATCH Score                    Lactation Tools Discussed/Used    Interventions    Discharge    Consult Status      Charyl Dancer 09/26/2023, 2:58 AM

## 2023-09-26 NOTE — Anesthesia Postprocedure Evaluation (Signed)
 Anesthesia Post Note  Patient: Tara Mccarthy  Procedure(s) Performed: CESAREAN DELIVERY (Abdomen)     Patient location during evaluation: PACU Anesthesia Type: Epidural Level of consciousness: awake, awake and alert and oriented Pain management: pain level controlled Vital Signs Assessment: post-procedure vital signs reviewed and stable Respiratory status: spontaneous breathing, nonlabored ventilation and respiratory function stable Cardiovascular status: stable Postop Assessment: no headache, no backache, epidural receding, patient able to bend at knees and no signs of nausea or vomiting Anesthetic complications: no   No notable events documented.  Last Vitals:  Vitals:   09/26/23 0203 09/26/23 0300  BP: (!) 120/56 123/70  Pulse: 89 92  Resp: 18 18  Temp: 37.6 C 37.3 C  SpO2: 100% 98%    Last Pain:  Vitals:   09/26/23 0320  TempSrc:   PainSc: 0-No pain   Pain Goal:                   Collene Schlichter

## 2023-09-26 NOTE — Transfer of Care (Signed)
 Immediate Anesthesia Transfer of Care Note  Patient: Tara Mccarthy  Procedure(s) Performed: CESAREAN DELIVERY (Abdomen)  Patient Location: PACU  Anesthesia Type:Epidural  Level of Consciousness: awake, alert , and oriented  Airway & Oxygen Therapy: Patient Spontanous Breathing  Post-op Assessment: Report given to RN and Post -op Vital signs reviewed and stable  Post vital signs: Reviewed and stable  Last Vitals:  Vitals Value Taken Time  BP 110/46 09/26/23 0054  Temp    Pulse 130 09/26/23 0058  Resp 15 09/26/23 0058  SpO2 99 % 09/26/23 0058  Vitals shown include unfiled device data.  Last Pain:  Vitals:   09/25/23 2330  TempSrc: Axillary  PainSc:          Complications: No notable events documented.

## 2023-09-27 NOTE — Lactation Note (Signed)
 This note was copied from a baby's chart. Lactation Consultation Note  Patient Name: Tara Mccarthy ZOXWR'[id number] Date: 09/27/2023 Age:26 hours  Reason for consult: Exclusive pumping and bottle feeding;Follow-up assessment  Follow up LC visit with P1 mother who wants to pump and provide breast milk for her baby. Mother states she pumped last in the middle of the night. She collected 20 ml colostrum as seen in the refrigerator. Mother had questions regarding storage and preparation of her breast milk. Questions answered and discussed.   Mother plans to use her personal hands free pump from home today. Discussed in effort to stimulate her milk supply and avoid engorgement, to pump 8 times in 24 hours/ every 3 hours.   Discussed the process of milk production, "supply and demand" and the importance of breast stimulation and milk removal in order to make an optimal milk supply.  If substituting feeding with formula, advised to hand express and/or pump to remove milk from the breast.    Feeding Mother's Current Feeding Choice: Formula   Lactation Tools Discussed/Used Pumping frequency: encourged to pump to stimulate milk production   Discharge Pump: Hands Free  Consult Status Consult Status: Follow-up Date: 09/28/23 Follow-up type: In-patient    Christella Hartigan M 09/27/2023, 3:56 PM

## 2023-09-27 NOTE — Progress Notes (Signed)
 Subjective: Postpartum Day 1: Cesarean Delivery Patient reports tolerating PO and no problems voiding.  Requests abdominal binder.  Objective: Vital signs in last 24 hours: Temp:  [99.3 F (37.4 C)] 99.3 F (37.4 C) (03/13 1953) Pulse Rate:  [67-82] 82 (03/14 0540) Resp:  [16-18] 16 (03/14 0540) BP: (99-108)/(56-64) 107/56 (03/14 0540) SpO2:  [99 %-100 %] 100 % (03/14 0540)  Physical Exam:  General: alert, cooperative, and appears stated age 28: appropriate Uterine Fundus: firm Incision: healing well, no significant drainage, no dehiscence DVT Evaluation: No evidence of DVT seen on physical exam. Negative Homan's sign. No cords or calf tenderness.  Recent Labs    09/24/23 1535 09/26/23 0424  HGB 10.7* 9.0*  HCT 32.0* 27.1*    Assessment/Plan: Status post Cesarean section. Doing well postoperatively.  Continue current care. Abdominal binder ordered.  Mitchel Honour, DO 09/27/2023, 9:26 AM

## 2023-09-28 MED ORDER — FERROUS SULFATE 325 (65 FE) MG PO TABS: 325.0000 mg | ORAL_TABLET | ORAL | 0 refills | Status: AC

## 2023-09-28 MED ORDER — OXYCODONE HCL 5 MG PO TABS: 5.0000 mg | ORAL_TABLET | ORAL | 0 refills | Status: AC | PRN

## 2023-09-28 MED ORDER — IBUPROFEN 600 MG PO TABS: 600.0000 mg | ORAL_TABLET | Freq: Four times a day (QID) | ORAL | 0 refills | Status: AC | PRN

## 2023-09-28 NOTE — Discharge Summary (Signed)
 Postpartum Discharge Summary    Patient Name: Tara Mccarthy DOB: 1996-07-13 MRN: [id number]  Date of admission: 09/24/2023 Delivery date:09/26/2023 Delivering provider: Candice Camp Date of discharge: 09/28/2023  Admitting diagnosis: Indication for care in labor or delivery [O75.9] Intrauterine pregnancy: [redacted]w[redacted]d     Secondary diagnosis:  Principal Problem:   Indication for care in labor or delivery  Additional problems: none    Discharge diagnosis: Term Pregnancy Delivered                                              Post partum procedures: n/a Augmentation: AROM, Pitocin, and Cytotec Complications: None  Hospital course: Induction of Labor With Cesarean Section   28 y.o. yo G1P1001 at [redacted]w[redacted]d was admitted to the hospital 09/24/2023 for induction of labor. Patient had a labor course significant for  arrest of dilation. The patient went for cesarean section due to Arrest of Dilation. Delivery details are as follows: Membrane Rupture Time/Date: 5:55 PM,09/25/2023  Delivery Method:C-Section, Low Transverse Operative Delivery:N/A Details of operation can be found in separate operative Note.  Patient had a postpartum course complicated by n/a. She is ambulating, tolerating a regular diet, passing flatus, and urinating well.  Patient is discharged home in stable condition on 09/28/23.      Newborn Data: Birth date:09/26/2023 Birth time:12:06 AM Gender:Female Living status:Living Apgars:8 ,9  Weight:3310 g                               Magnesium Sulfate received: No BMZ received: No Rhophylac:No MMR:No T-DaP:Given prenatally Flu: No RSV Vaccine received: No Transfusion:No  Immunizations received: Immunization History  Administered Date(s) Administered   Td 04/08/2009   Tdap 08/28/2021    Physical exam  Vitals:   09/27/23 0540 09/27/23 1100 09/27/23 2300 09/28/23 0614  BP: (!) 107/56 115/67 115/64 103/75  Pulse: 82 (!) 102 73 71  Resp: 16 17 17 18   Temp:  98.5 F (36.9  C) 97.8 F (36.6 C) 97.8 F (36.6 C)  TempSrc:  Oral Oral Oral  SpO2: 100%  99% 100%  Weight:      Height:       General: alert, cooperative, and no distress Lochia: appropriate Uterine Fundus: firm Incision: Healing well with no significant drainage, No significant erythema, Dressing is clean, dry, and intact DVT Evaluation: No evidence of DVT seen on physical exam. Negative Homan's sign. No cords or calf tenderness. Labs: Lab Results  Component Value Date   WBC 14.9 (H) 09/26/2023   HGB 9.0 (L) 09/26/2023   HCT 27.1 (L) 09/26/2023   MCV 83.6 09/26/2023   PLT 113 (L) 09/26/2023      Latest Ref Rng & Units 08/09/2023   11:10 AM  CMP  Glucose 70 - 99 mg/dL 086   BUN 6 - 20 mg/dL 8   Creatinine 5.78 - 4.69 mg/dL 6.29   Sodium 528 - 413 mmol/L 134   Potassium 3.5 - 5.1 mmol/L 3.5   Chloride 98 - 111 mmol/L 103   CO2 22 - 32 mmol/L 21   Calcium 8.9 - 10.3 mg/dL 8.6   Total Protein 6.5 - 8.1 g/dL 5.4   Total Bilirubin 0.0 - 1.2 mg/dL 0.4   Alkaline Phos 38 - 126 U/L 67   AST 15 - 41 U/L 15  ALT 0 - 44 U/L 12    Edinburgh Score:    09/26/2023    8:04 AM  Edinburgh Postnatal Depression Scale Screening Tool  I have been able to laugh and see the funny side of things. 0  I have looked forward with enjoyment to things. 0  I have blamed myself unnecessarily when things went wrong. 1  I have been anxious or worried for no good reason. 0  I have felt scared or panicky for no good reason. 0  Things have been getting on top of me. 0  I have been so unhappy that I have had difficulty sleeping. 0  I have felt sad or miserable. 0  I have been so unhappy that I have been crying. 0  The thought of harming myself has occurred to me. 0  Edinburgh Postnatal Depression Scale Total 1   No data recorded  After visit meds:  Allergies as of 09/28/2023       Reactions   Ceftriaxone Rash        Medication List     STOP taking these medications    ondansetron 4 MG  disintegrating tablet Commonly known as: ZOFRAN-ODT   tamsulosin 0.4 MG Caps capsule Commonly known as: FLOMAX       TAKE these medications    albuterol 108 (90 Base) MCG/ACT inhaler Commonly known as: VENTOLIN HFA INHALE 2 PUFFS EVERY 4-6 HOURS AS NEEDED   ferrous sulfate 325 (65 FE) MG tablet Take 1 tablet (325 mg total) by mouth every other day.   ibuprofen 600 MG tablet Commonly known as: ADVIL Take 1 tablet (600 mg total) by mouth every 6 (six) hours as needed for mild pain (pain score 1-3) or moderate pain (pain score 4-6).   multivitamin-prenatal 27-0.8 MG Tabs tablet Take 1 tablet by mouth daily at 12 noon.   oxyCODONE 5 MG immediate release tablet Commonly known as: Oxy IR/ROXICODONE Take 1 tablet (5 mg total) by mouth every 4 (four) hours as needed for up to 7 days for severe pain (pain score 7-10). What changed:  when to take this reasons to take this         Discharge home in stable condition Infant Feeding: Breast Infant Disposition:home with mother Discharge instruction: per After Visit Summary and Postpartum booklet. Activity: Advance as tolerated. Pelvic rest for 6 weeks.  Diet: routine diet Future Appointments:No future appointments. Follow up Visit: 6 weeks PPV   09/28/2023 Mitchel Honour, DO

## 2023-09-28 NOTE — Progress Notes (Signed)
 Subjective: Postpartum Day 2: Cesarean Delivery Patient reports tolerating PO, + flatus, and no problems voiding.    Objective: Vital signs in last 24 hours: Temp:  [97.8 F (36.6 C)-98.5 F (36.9 C)] 97.8 F (36.6 C) (03/15 0614) Pulse Rate:  [71-102] 71 (03/15 0614) Resp:  [17-18] 18 (03/15 0614) BP: (103-115)/(64-75) 103/75 (03/15 0614) SpO2:  [99 %-100 %] 100 % (03/15 3875)  Physical Exam:  General: alert, cooperative, and appears stated age Lochia: appropriate Uterine Fundus: firm Incision: healing well, no significant drainage, no dehiscence DVT Evaluation: No evidence of DVT seen on physical exam. Negative Homan's sign. No cords or calf tenderness.  Recent Labs    09/26/23 0424  HGB 9.0*  HCT 27.1*    Assessment/Plan: Status post Cesarean section. Doing well postoperatively.  Discharge home with standard precautions and return to clinic in 4-6 weeks.  Mitchel Honour, DO 09/28/2023, 8:23 AM

## 2023-09-28 NOTE — Discharge Instructions (Signed)
 Call MD for T>100.4, heavy vaginal bleeding, severe abdominal pain, intractable nausea and/or vomiting, or respiratory distress.  Call office to schedule postpartum visit in 6 weeks.  Pelvic rest x 6 weeks.  No driving while taking narcotics.

## 2023-09-30 ENCOUNTER — Inpatient Hospital Stay (HOSPITAL_COMMUNITY)

## 2023-10-08 ENCOUNTER — Telehealth (HOSPITAL_COMMUNITY): Payer: Self-pay | Admitting: *Deleted

## 2023-10-08 NOTE — Telephone Encounter (Signed)
 10/08/2023  Name: Tara Mccarthy MRN: [id number] DOB: 1996/07/14  Reason for Call:  Transition of Care Hospital Discharge Call  Contact Status: Patient Contact Status: Complete  Language assistant needed: Interpreter Mode: Interpreter Not Needed        Follow-Up Questions: Do You Have Any Concerns About Your Health As You Heal From Delivery?: No Do You Have Any Concerns About Your Infants Health?: No  Edinburgh Postnatal Depression Scale:  In the Past 7 Days: I have been able to laugh and see the funny side of things.: As much as I always could I have looked forward with enjoyment to things.: As much as I ever did I have blamed myself unnecessarily when things went wrong.: No, never I have been anxious or worried for no good reason.: No, not at all I have felt scared or panicky for no good reason.: No, not at all Things have been getting on top of me.: No, I have been coping as well as ever I have been so unhappy that I have had difficulty sleeping.: Not at all I have felt sad or miserable.: Not very often I have been so unhappy that I have been crying.: No, never The thought of harming myself has occurred to me.: Never [name] Postnatal Depression Scale Total: 1  PHQ2-9 Depression Scale:     Discharge Follow-up: Edinburgh score requires follow up?: No Patient was advised of the following resources:: Support Group, Breastfeeding Support Group (declines postpartum group information via email)  Post-discharge interventions: Reviewed Newborn Safe Sleep Practices  Salena Saner, RN 10/08/2023 10:56

## 2023-10-11 MED FILL — Oxytocin Inj 10 Unit/ML: INTRAMUSCULAR | Qty: 3 | Status: AC

## 2024-04-16 ENCOUNTER — Ambulatory Visit: Admitting: Nurse Practitioner

## 2024-04-23 ENCOUNTER — Ambulatory Visit: Admitting: Nurse Practitioner

## 2024-05-01 ENCOUNTER — Telehealth: Payer: Self-pay | Admitting: Nurse Practitioner

## 2024-05-01 ENCOUNTER — Encounter: Payer: Self-pay | Admitting: Nurse Practitioner

## 2024-05-01 NOTE — Telephone Encounter (Signed)
 Noted

## 2024-05-01 NOTE — Telephone Encounter (Signed)
 04/16/2024 same day cancellation & 04/23/2024  no show  Final warning sent via mail and mychart
